# Patient Record
Sex: Female | Born: 1935
Health system: Southern US, Community
[De-identification: ages and names within clinical notes are randomized; demographics above are authoritative.]

## PROBLEM LIST (undated history)

## (undated) DIAGNOSIS — N201 Calculus of ureter: Secondary | ICD-10-CM

## (undated) DIAGNOSIS — I10 Essential (primary) hypertension: Secondary | ICD-10-CM

## (undated) DIAGNOSIS — Z8719 Personal history of other diseases of the digestive system: Secondary | ICD-10-CM

## (undated) DIAGNOSIS — Z87442 Personal history of urinary calculi: Secondary | ICD-10-CM

## (undated) DIAGNOSIS — N200 Calculus of kidney: Secondary | ICD-10-CM

## (undated) HISTORY — PX: APPENDECTOMY: SHX54

## (undated) HISTORY — PX: DILATION AND CURETTAGE OF UTERUS: SHX78

## (undated) HISTORY — PX: ABDOMINAL HYSTERECTOMY: SHX81

## (undated) HISTORY — PX: CATARACT EXTRACTION: SUR2

---

## 1998-01-29 ENCOUNTER — Ambulatory Visit (HOSPITAL_COMMUNITY): Admission: RE | Admit: 1998-01-29 | Discharge: 1998-01-29 | Payer: Self-pay | Admitting: Family Medicine

## 1998-09-11 ENCOUNTER — Encounter: Payer: Self-pay | Admitting: Family Medicine

## 1998-09-11 ENCOUNTER — Ambulatory Visit (HOSPITAL_COMMUNITY): Admission: RE | Admit: 1998-09-11 | Discharge: 1998-09-11 | Payer: Self-pay | Admitting: Family Medicine

## 1998-09-26 ENCOUNTER — Ambulatory Visit (HOSPITAL_COMMUNITY): Admission: RE | Admit: 1998-09-26 | Discharge: 1998-09-27 | Payer: Self-pay | Admitting: Family Medicine

## 1998-09-26 ENCOUNTER — Encounter: Payer: Self-pay | Admitting: Family Medicine

## 2000-11-30 ENCOUNTER — Other Ambulatory Visit: Admission: RE | Admit: 2000-11-30 | Discharge: 2000-11-30 | Payer: Self-pay | Admitting: *Deleted

## 2004-01-24 ENCOUNTER — Other Ambulatory Visit: Admission: RE | Admit: 2004-01-24 | Discharge: 2004-01-24 | Payer: Self-pay | Admitting: Family Medicine

## 2011-08-13 DIAGNOSIS — I10 Essential (primary) hypertension: Secondary | ICD-10-CM | POA: Diagnosis not present

## 2011-08-13 DIAGNOSIS — M549 Dorsalgia, unspecified: Secondary | ICD-10-CM | POA: Diagnosis not present

## 2011-08-13 DIAGNOSIS — N951 Menopausal and female climacteric states: Secondary | ICD-10-CM | POA: Diagnosis not present

## 2011-08-13 DIAGNOSIS — L219 Seborrheic dermatitis, unspecified: Secondary | ICD-10-CM | POA: Diagnosis not present

## 2011-08-13 DIAGNOSIS — E785 Hyperlipidemia, unspecified: Secondary | ICD-10-CM | POA: Diagnosis not present

## 2012-02-12 DIAGNOSIS — E785 Hyperlipidemia, unspecified: Secondary | ICD-10-CM | POA: Diagnosis not present

## 2012-02-12 DIAGNOSIS — I10 Essential (primary) hypertension: Secondary | ICD-10-CM | POA: Diagnosis not present

## 2012-02-12 DIAGNOSIS — N951 Menopausal and female climacteric states: Secondary | ICD-10-CM | POA: Diagnosis not present

## 2012-02-12 DIAGNOSIS — L820 Inflamed seborrheic keratosis: Secondary | ICD-10-CM | POA: Diagnosis not present

## 2012-02-12 DIAGNOSIS — Z23 Encounter for immunization: Secondary | ICD-10-CM | POA: Diagnosis not present

## 2012-02-25 DIAGNOSIS — H5200 Hypermetropia, unspecified eye: Secondary | ICD-10-CM | POA: Diagnosis not present

## 2012-02-25 DIAGNOSIS — H524 Presbyopia: Secondary | ICD-10-CM | POA: Diagnosis not present

## 2012-02-25 DIAGNOSIS — H10439 Chronic follicular conjunctivitis, unspecified eye: Secondary | ICD-10-CM | POA: Diagnosis not present

## 2012-02-25 DIAGNOSIS — H52229 Regular astigmatism, unspecified eye: Secondary | ICD-10-CM | POA: Diagnosis not present

## 2012-03-04 DIAGNOSIS — Z1231 Encounter for screening mammogram for malignant neoplasm of breast: Secondary | ICD-10-CM | POA: Diagnosis not present

## 2012-03-04 DIAGNOSIS — Z13820 Encounter for screening for osteoporosis: Secondary | ICD-10-CM | POA: Diagnosis not present

## 2012-08-11 DIAGNOSIS — N951 Menopausal and female climacteric states: Secondary | ICD-10-CM | POA: Diagnosis not present

## 2012-08-11 DIAGNOSIS — E785 Hyperlipidemia, unspecified: Secondary | ICD-10-CM | POA: Diagnosis not present

## 2012-08-11 DIAGNOSIS — I10 Essential (primary) hypertension: Secondary | ICD-10-CM | POA: Diagnosis not present

## 2012-10-12 DIAGNOSIS — E785 Hyperlipidemia, unspecified: Secondary | ICD-10-CM | POA: Diagnosis not present

## 2012-11-15 DIAGNOSIS — L740 Miliaria rubra: Secondary | ICD-10-CM | POA: Diagnosis not present

## 2013-02-09 DIAGNOSIS — E785 Hyperlipidemia, unspecified: Secondary | ICD-10-CM | POA: Diagnosis not present

## 2013-02-09 DIAGNOSIS — Z23 Encounter for immunization: Secondary | ICD-10-CM | POA: Diagnosis not present

## 2013-02-09 DIAGNOSIS — I10 Essential (primary) hypertension: Secondary | ICD-10-CM | POA: Diagnosis not present

## 2013-02-09 DIAGNOSIS — N951 Menopausal and female climacteric states: Secondary | ICD-10-CM | POA: Diagnosis not present

## 2013-02-28 DIAGNOSIS — H52229 Regular astigmatism, unspecified eye: Secondary | ICD-10-CM | POA: Diagnosis not present

## 2013-02-28 DIAGNOSIS — H01009 Unspecified blepharitis unspecified eye, unspecified eyelid: Secondary | ICD-10-CM | POA: Diagnosis not present

## 2013-02-28 DIAGNOSIS — H2589 Other age-related cataract: Secondary | ICD-10-CM | POA: Diagnosis not present

## 2013-02-28 DIAGNOSIS — H5200 Hypermetropia, unspecified eye: Secondary | ICD-10-CM | POA: Diagnosis not present

## 2013-05-02 DIAGNOSIS — H18419 Arcus senilis, unspecified eye: Secondary | ICD-10-CM | POA: Diagnosis not present

## 2013-05-02 DIAGNOSIS — H2510 Age-related nuclear cataract, unspecified eye: Secondary | ICD-10-CM | POA: Diagnosis not present

## 2013-05-02 DIAGNOSIS — H25019 Cortical age-related cataract, unspecified eye: Secondary | ICD-10-CM | POA: Diagnosis not present

## 2013-05-02 DIAGNOSIS — H25049 Posterior subcapsular polar age-related cataract, unspecified eye: Secondary | ICD-10-CM | POA: Diagnosis not present

## 2013-05-28 DIAGNOSIS — Z8719 Personal history of other diseases of the digestive system: Secondary | ICD-10-CM

## 2013-05-28 HISTORY — DX: Personal history of other diseases of the digestive system: Z87.19

## 2013-06-18 ENCOUNTER — Encounter (HOSPITAL_BASED_OUTPATIENT_CLINIC_OR_DEPARTMENT_OTHER): Payer: Self-pay | Admitting: Emergency Medicine

## 2013-06-18 ENCOUNTER — Inpatient Hospital Stay (HOSPITAL_BASED_OUTPATIENT_CLINIC_OR_DEPARTMENT_OTHER)
Admission: EM | Admit: 2013-06-18 | Discharge: 2013-06-22 | DRG: 379 | Disposition: A | Discharge status: Home / Self Care | Payer: Medicare Other | Attending: Internal Medicine | Admitting: Internal Medicine

## 2013-06-18 ENCOUNTER — Emergency Department (HOSPITAL_BASED_OUTPATIENT_CLINIC_OR_DEPARTMENT_OTHER): Payer: Medicare Other

## 2013-06-18 DIAGNOSIS — I10 Essential (primary) hypertension: Secondary | ICD-10-CM | POA: Diagnosis present

## 2013-06-18 DIAGNOSIS — K529 Noninfective gastroenteritis and colitis, unspecified: Secondary | ICD-10-CM | POA: Diagnosis present

## 2013-06-18 DIAGNOSIS — J301 Allergic rhinitis due to pollen: Secondary | ICD-10-CM | POA: Diagnosis present

## 2013-06-18 DIAGNOSIS — E872 Acidosis, unspecified: Secondary | ICD-10-CM

## 2013-06-18 DIAGNOSIS — K922 Gastrointestinal hemorrhage, unspecified: Secondary | ICD-10-CM

## 2013-06-18 DIAGNOSIS — D72829 Elevated white blood cell count, unspecified: Secondary | ICD-10-CM | POA: Diagnosis not present

## 2013-06-18 DIAGNOSIS — R109 Unspecified abdominal pain: Secondary | ICD-10-CM | POA: Diagnosis not present

## 2013-06-18 DIAGNOSIS — K5289 Other specified noninfective gastroenteritis and colitis: Secondary | ICD-10-CM | POA: Diagnosis present

## 2013-06-18 DIAGNOSIS — Z7982 Long term (current) use of aspirin: Secondary | ICD-10-CM | POA: Diagnosis not present

## 2013-06-18 DIAGNOSIS — Z79899 Other long term (current) drug therapy: Secondary | ICD-10-CM | POA: Diagnosis not present

## 2013-06-18 DIAGNOSIS — J309 Allergic rhinitis, unspecified: Secondary | ICD-10-CM

## 2013-06-18 DIAGNOSIS — N200 Calculus of kidney: Secondary | ICD-10-CM | POA: Diagnosis not present

## 2013-06-18 DIAGNOSIS — R1032 Left lower quadrant pain: Secondary | ICD-10-CM | POA: Diagnosis not present

## 2013-06-18 DIAGNOSIS — K5733 Diverticulitis of large intestine without perforation or abscess with bleeding: Principal | ICD-10-CM | POA: Diagnosis present

## 2013-06-18 DIAGNOSIS — J302 Other seasonal allergic rhinitis: Secondary | ICD-10-CM | POA: Diagnosis present

## 2013-06-18 HISTORY — DX: Essential (primary) hypertension: I10

## 2013-06-18 LAB — COMPREHENSIVE METABOLIC PANEL
ALT: 12 U/L (ref 0–35)
AST: 21 U/L (ref 0–37)
Albumin: 3.9 g/dL (ref 3.5–5.2)
Alkaline Phosphatase: 70 U/L (ref 39–117)
BUN: 20 mg/dL (ref 6–23)
CO2: 27 mEq/L (ref 19–32)
Calcium: 9.8 mg/dL (ref 8.4–10.5)
Chloride: 99 mEq/L (ref 96–112)
Creatinine, Ser: 0.8 mg/dL (ref 0.50–1.10)
GFR calc Af Amer: 80 mL/min — ABNORMAL LOW (ref >90)
GFR calc non Af Amer: 69 mL/min — ABNORMAL LOW (ref >90)
Glucose, Bld: 120 mg/dL — ABNORMAL HIGH (ref 70–99)
Potassium: 3.9 mEq/L (ref 3.7–5.3)
Sodium: 141 mEq/L (ref 137–147)
Total Bilirubin: 0.5 mg/dL (ref 0.3–1.2)
Total Protein: 7.8 g/dL (ref 6.0–8.3)

## 2013-06-18 LAB — CBC WITH DIFFERENTIAL/PLATELET
Basophils Absolute: 0 10*3/uL (ref 0.0–0.1)
Basophils Relative: 0 % (ref 0–1)
Eosinophils Absolute: 0.1 10*3/uL (ref 0.0–0.7)
Eosinophils Relative: 1 % (ref 0–5)
HCT: 40.6 % (ref 36.0–46.0)
Hemoglobin: 13.4 g/dL (ref 12.0–15.0)
Lymphocytes Relative: 21 % (ref 12–46)
Lymphs Abs: 3.8 10*3/uL (ref 0.7–4.0)
MCH: 30.5 pg (ref 26.0–34.0)
MCHC: 33 g/dL (ref 30.0–36.0)
MCV: 92.5 fL (ref 78.0–100.0)
Monocytes Absolute: 1.4 10*3/uL — ABNORMAL HIGH (ref 0.1–1.0)
Monocytes Relative: 8 % (ref 3–12)
Neutro Abs: 13 10*3/uL — ABNORMAL HIGH (ref 1.7–7.7)
Neutrophils Relative %: 71 % (ref 43–77)
Platelets: 211 10*3/uL (ref 150–400)
RBC: 4.39 MIL/uL (ref 3.87–5.11)
RDW: 12.5 % (ref 11.5–15.5)
WBC: 18.3 10*3/uL — ABNORMAL HIGH (ref 4.0–10.5)

## 2013-06-18 LAB — URINALYSIS, ROUTINE W REFLEX MICROSCOPIC
BILIRUBIN URINE: NEGATIVE
Glucose, UA: NEGATIVE mg/dL
Hgb urine dipstick: NEGATIVE
KETONES UR: NEGATIVE mg/dL
Leukocytes, UA: NEGATIVE
Nitrite: NEGATIVE
PH: 7 (ref 5.0–8.0)
Protein, ur: NEGATIVE mg/dL
Specific Gravity, Urine: 1.017 (ref 1.005–1.030)
Urobilinogen, UA: 0.2 mg/dL (ref 0.0–1.0)

## 2013-06-18 LAB — I-STAT CG4 LACTIC ACID, ED: Lactic Acid, Venous: 2.84 mmol/L — ABNORMAL HIGH (ref 0.5–2.2)

## 2013-06-18 LAB — OCCULT BLOOD X 1 CARD TO LAB, STOOL: Fecal Occult Bld: POSITIVE — AB

## 2013-06-18 MED ORDER — ACETAMINOPHEN 325 MG PO TABS: 650.0000 mg | ORAL_TABLET | Freq: Four times a day (QID) | ORAL | Status: DC | PRN

## 2013-06-18 MED ORDER — MORPHINE SULFATE 2 MG/ML IJ SOLN: 2.0000 mg | INTRAMUSCULAR | Status: DC | PRN

## 2013-06-18 MED ORDER — ONDANSETRON HCL 4 MG/2ML IJ SOLN: 4.0000 mg | Freq: Four times a day (QID) | INTRAMUSCULAR | Status: DC | PRN

## 2013-06-18 MED ORDER — MORPHINE SULFATE 4 MG/ML IJ SOLN
4.0000 mg | Freq: Once | INTRAMUSCULAR | Status: AC
  Administered 2013-06-18: 4 mg via INTRAVENOUS
  Filled 2013-06-18: qty 1

## 2013-06-18 MED ORDER — METRONIDAZOLE IN NACL 5-0.79 MG/ML-% IV SOLN
500.0000 mg | Freq: Once | INTRAVENOUS | Status: AC
  Administered 2013-06-18: 500 mg via INTRAVENOUS
  Filled 2013-06-18: qty 100

## 2013-06-18 MED ORDER — SODIUM CHLORIDE 0.9 % IV SOLN
INTRAVENOUS | Status: DC
  Administered 2013-06-18 – 2013-06-21 (×4): via INTRAVENOUS

## 2013-06-18 MED ORDER — CIPROFLOXACIN IN D5W 400 MG/200ML IV SOLN
400.0000 mg | Freq: Two times a day (BID) | INTRAVENOUS | Status: DC
  Administered 2013-06-18 – 2013-06-21 (×6): 400 mg via INTRAVENOUS
  Filled 2013-06-18 (×7): qty 200

## 2013-06-18 MED ORDER — ACETAMINOPHEN 650 MG RE SUPP: 650.0000 mg | Freq: Four times a day (QID) | RECTAL | Status: DC | PRN

## 2013-06-18 MED ORDER — ONDANSETRON HCL 4 MG PO TABS: 4.0000 mg | ORAL_TABLET | Freq: Four times a day (QID) | ORAL | Status: DC | PRN

## 2013-06-18 MED ORDER — METRONIDAZOLE IN NACL 5-0.79 MG/ML-% IV SOLN
500.0000 mg | Freq: Three times a day (TID) | INTRAVENOUS | Status: DC
  Administered 2013-06-18 – 2013-06-21 (×9): 500 mg via INTRAVENOUS
  Filled 2013-06-18 (×10): qty 100

## 2013-06-18 MED ORDER — ALUM & MAG HYDROXIDE-SIMETH 200-200-20 MG/5ML PO SUSP
30.0000 mL | Freq: Four times a day (QID) | ORAL | Status: DC | PRN
  Administered 2013-06-19 – 2013-06-22 (×7): 30 mL via ORAL
  Filled 2013-06-18 (×7): qty 30

## 2013-06-18 MED ORDER — CIPROFLOXACIN IN D5W 400 MG/200ML IV SOLN
400.0000 mg | Freq: Once | INTRAVENOUS | Status: AC
  Administered 2013-06-18: 400 mg via INTRAVENOUS
  Filled 2013-06-18: qty 200

## 2013-06-18 MED ORDER — IOHEXOL 300 MG/ML  SOLN
50.0000 mL | Freq: Once | INTRAMUSCULAR | Status: AC | PRN
  Administered 2013-06-18: 50 mL via ORAL

## 2013-06-18 MED ORDER — SODIUM CHLORIDE 0.9 % IV BOLUS (SEPSIS)
1000.0000 mL | Freq: Once | INTRAVENOUS | Status: AC
  Administered 2013-06-18: 1000 mL via INTRAVENOUS

## 2013-06-18 MED ORDER — IOHEXOL 300 MG/ML  SOLN
100.0000 mL | Freq: Once | INTRAMUSCULAR | Status: AC | PRN
  Administered 2013-06-18: 100 mL via INTRAVENOUS

## 2013-06-18 NOTE — ED Provider Notes (Signed)
CSN: [id number]     Arrival date & time 06/18/13  4098 History   First MD Initiated Contact with Patient 06/18/13 [phone]     Chief Complaint  Patient presents with  . Abdominal Pain     (Consider location/radiation/quality/duration/timing/severity/associated sxs/prior Treatment) HPI  This is a 80 year old female with a history of hypertension who presents with abdominal pain and prior red blood per rectum. Patient states that she had onset of abdominal cramping last night at 10 PM. She subsequently had a bowel movement that was "normal" that relieved her abdominal pain. She reports lower crampy abdominal pain that comes and goes. She states that it comes on and 10 out of 10 but then it gets much better. She currently is pain-free. It is nonradiating. Patient states that she had pain every hour or so last night and she had the urge to have a bowel movement. She didn't bathroom without any "results." She states that she noted 3-4 times that she had a quarter-sized amount of bright red blood the toilet. This was not associated with a bowel movement. She denies any other symptoms including fever, vomiting, chest pain or shortness of breath, urinary symptoms.  Past Medical History  Diagnosis Date  . Hypertension    Past Surgical History  Procedure Laterality Date  . Abdominal hysterectomy    . Appendectomy     No family history on file. History  Substance Use Topics  . Smoking status: Never Smoker   . Smokeless tobacco: Not on file  . Alcohol Use: No   OB History   Grav Para Term Preterm Abortions TAB SAB Ect Mult Living                 Review of Systems  Constitutional: Negative for fever and chills.  Respiratory: Negative for cough, chest tightness and shortness of breath.   Cardiovascular: Negative for chest pain.  Gastrointestinal: Positive for abdominal pain and blood in stool. Negative for nausea, vomiting and diarrhea.  Genitourinary: Negative for dysuria, hematuria and  vaginal bleeding.  Musculoskeletal: Negative for back pain.  Skin: Negative for wound.  Neurological: Negative for dizziness and headaches.  Psychiatric/Behavioral: Negative for confusion.  All other systems reviewed and are negative.      Allergies  Penicillins  Home Medications   Current Outpatient Rx  Name  Route  Sig  Dispense  Refill  . aspirin 81 MG tablet   Oral   Take 81 mg by mouth daily.         Marland Kitchen estradiol (ESTRACE) 0.5 MG tablet   Oral   Take 0.5 mg by mouth daily.         Marland Kitchen loratadine (CLARITIN) 10 MG tablet   Oral   Take 10 mg by mouth daily.         Marland Kitchen losartan-hydrochlorothiazide (HYZAAR) 100-12.5 MG per tablet   Oral   Take 1 tablet by mouth daily.         . Multiple Vitamins-Minerals (MULTIVITAMIN WITH MINERALS) tablet   Oral   Take 1 tablet by mouth daily.          BP 118/69  Pulse 95  Temp(Src) 98.4 F (36.9 C) (Oral)  Resp 18  SpO2 98% Physical Exam  Nursing note and vitals reviewed. Constitutional: She is oriented to person, place, and time. She appears well-developed and well-nourished. No distress.  HENT:  Head: Normocephalic and atraumatic.  Mouth/Throat: Oropharynx is clear and moist.  Eyes: Pupils are equal, round, and reactive to  light.  Neck: Neck supple.  Cardiovascular: Normal rate, regular rhythm and normal heart sounds.   No murmur heard. Pulmonary/Chest: Effort normal and breath sounds normal. No respiratory distress. She has no wheezes.  Abdominal: Soft. Bowel sounds are normal. She exhibits no distension and no mass. There is no tenderness. There is no rebound and no guarding.  Genitourinary:  Small amount of gross blood on rectal exam, hemorrhoid noted at 9:00 without significant bleeding, no significant tenderness  Musculoskeletal: She exhibits no edema.  Neurological: She is alert and oriented to person, place, and time.  Skin: Skin is warm and dry.  Psychiatric: She has a normal mood and affect.    ED  Course  Procedures (including critical care time) Labs Review Labs Reviewed  CBC WITH DIFFERENTIAL - Abnormal; Notable for the following:    WBC 18.3 (*)    Neutro Abs 13.0 (*)    Monocytes Absolute 1.4 (*)    All other components within normal limits  COMPREHENSIVE METABOLIC PANEL - Abnormal; Notable for the following:    Glucose, Bld 120 (*)    GFR calc non Af Amer 69 (*)    GFR calc Af Amer 80 (*)    All other components within normal limits  OCCULT BLOOD X 1 CARD TO LAB, STOOL - Abnormal; Notable for the following:    Fecal Occult Bld POSITIVE (*)    All other components within normal limits  URINALYSIS, ROUTINE W REFLEX MICROSCOPIC - Abnormal; Notable for the following:    APPearance CLOUDY (*)    All other components within normal limits  I-STAT CG4 LACTIC ACID, ED - Abnormal; Notable for the following:    Lactic Acid, Venous 2.84 (*)    All other components within normal limits   Imaging Review Dg Abd 1 View  06/18/2013   CLINICAL DATA:  Abdominal pain.  EXAM: ABDOMEN - 1 VIEW  COMPARISON:  None.  FINDINGS: Single erect view was performed per ordering physician. There is no evidence of free intraperitoneal air on this study. No dilated bowel loops seen in the upper abdomen. A 6 mm radiodensity is seen overlying left kidney, suspicious for a left renal calculus.  IMPRESSION: No evidence of free intraperitoneal air. Probable left renal calculus.   Electronically Signed   By: Earle Gell M.D.   On: 06/18/2013 09:54   Ct Abdomen Pelvis W Contrast  06/18/2013   CLINICAL DATA:  Abdominal pain and elevated white blood cell count  EXAM: CT ABDOMEN AND PELVIS WITH CONTRAST  TECHNIQUE: Multidetector CT imaging of the abdomen and pelvis was performed using the standard protocol following bolus administration of intravenous contrast.  CONTRAST:  92mL OMNIPAQUE IOHEXOL 300 MG/ML SOLN, 131mL OMNIPAQUE IOHEXOL 300 MG/ML SOLN  COMPARISON:  None.  FINDINGS: The lung bases are clear. No pleural or  pericardial effusion identified.  No focal liver abnormality identified. The gallbladder appears normal. No biliary dilatation. Normal appearance of the pancreas. The spleen appears normal.  The adrenal glands are both within normal limits. The right kidney appears normal. Left renal calculi are identified. The largest stone is in the inferior pole of the left kidney measuring 6 mm, image number 36/series 2. The urinary bladder appears normal. Previous hysterectomy. Cystic structure within the right adnexa measures 4.9 cm, image 70/series 2. This contains a thin internal area of septation, image 54/series 5.  There is calcified atherosclerotic disease involving the abdominal aorta. No aneurysm. No upper abdominal adenopathy identified. There is no pelvic or inguinal  adenopathy noted.  The stomach appears normal. The small bowel loops have a normal caliber without obstruction. The proximal colon appears normal. Abnormal wall thickening and inflammation involves the sigmoid colon. There is no perforation or abscess.  IMPRESSION: 1. Abnormal wall thickening and inflammation involving the sigmoid colon compatible with either segmental colitis or diverticulitis. The patient does not have many diverticula and there is no evidence for a perforated diverticula which favors inflammatory or infectious segmental colitis. 2. Mildly complicated cyst arising from the right adnexa. Recommend further assessment with pelvic sonogram. 3. Left renal calculi 4. Atherosclerotic disease.   Electronically Signed   By: Kerby Moors M.D.   On: 06/18/2013 11:45    EKG Interpretation   None       MDM   Final diagnoses:  Colitis  Lactic acidosis    Patient presents with left lower quadrant pain and rectal bleeding.  Exam is benign. She's not significantly tender and denies pain at this time. Concern for diverticulitis versus diverticulosis, colitis. She denies any postprandial pain.  10:00 AM Patient noted to have mild  lactic acidosis and leukocytosis to 18. Patient given fluids and CT scan ordered.  CT shows evidence of colitis versus diverticulitis. On reexamination, patient now endorses worsening pain. She is tender to palpation of the left lower quadrant. She is without signs of peritonitis. Patient was given fluids, Cipro, Flagyl. She continues to have rectal bleeding here.    Merryl Hacker, MD 06/18/13 1302

## 2013-06-18 NOTE — H&P (Signed)
[facility] History and Physical  Sandra Mullins [id number] DOB: 09/06/1932 DOA: 06/18/2013  Referring physician: EDP PCP: Shirline Frees, MD   Chief Complaint: abdominal pain and blood per rectum  HPI: Sandra Mullins is a 80 y.o. female  Presented to [facility] with initially upper abdominal tenderness that became localized to the left lower quadrant. She has had rectal bleeding. Her symptoms started last night and worsened. Initially, she had cramping and multiple loose stools. She did not turn on the light so did not realize they were bloody until early this morning. She has passed clots. She has had no nausea or vomiting. Apparently, her primary care provider attempted a flexible sigmoidoscopy which was not completed due to difficult anatomy per her report. Otherwise she's never had a total colonoscopy. She has had no fevers or chills. No weight loss. Prior to last night, has had normal stools. She has not been on antibiotics recently. No history of same. In the emergency room, she had a rectal exam which showed small amount of blood. CT of the abdomen and pelvis showed segmental colitis versus diverticulitis involving the sigmoid colon. White blood cell count was 18,000. Lactic acid was slightly elevated. Patient was afebrile and had normal vital signs. Currently she feels a little better. She has been transferred to the floor. Past medical history only significant for hypertension and seasonal allergies. She is a widow and lives alone, very independent.   Review of Systems:  Systems reviewed. As above otherwise negative.  Past Medical History  Diagnosis Date  . Hypertension    Past Surgical History  Procedure Laterality Date  . Abdominal hysterectomy    . Appendectomy     Social History:  reports that she has never smoked. She does not have any smokeless tobacco history on file. She reports that she does not drink alcohol or use illicit drugs.  Allergies    Allergen Reactions  . Penicillins     Family History  Problem Relation Age of Onset  . Lung cancer Mother   . COPD Father      Prior to Admission medications   Medication Sig Start Date End Date Taking? Authorizing Provider  aspirin 81 MG tablet Take 81 mg by mouth daily.   Yes Historical Provider, MD  estradiol (ESTRACE) 0.5 MG tablet Take 0.5 mg by mouth daily.   Yes Historical Provider, MD  loratadine (CLARITIN) 10 MG tablet Take 10 mg by mouth daily.   Yes Historical Provider, MD  losartan-hydrochlorothiazide (HYZAAR) 100-12.5 MG per tablet Take 1 tablet by mouth daily.   Yes Historical Provider, MD  Multiple Vitamins-Minerals (MULTIVITAMIN WITH MINERALS) tablet Take 1 tablet by mouth daily.   Yes Historical Provider, MD   Physical Exam: Filed Vitals:   06/18/13 1456  BP: 148/74  Pulse: 75  Temp: 97.6 F (36.4 C)  Resp: 18    BP 148/74  Pulse 75  Temp(Src) 97.6 F (36.4 C) (Oral)  Resp 18  Ht 5\' 4"  (1.626 m)  Wt 75.8 kg (167 lb 1.7 oz)  BMI 28.67 kg/m2  SpO2 97% BP 148/74  Pulse 75  Temp(Src) 97.6 F (36.4 C) (Oral)  Resp 18  Ht 5\' 4"  (1.626 m)  Wt 75.8 kg (167 lb 1.7 oz)  BMI 28.67 kg/m2  SpO2 97%  General Appearance:    Alert, cooperative, no distress, nontoxic, appears stated age  Head:    Normocephalic, without obvious abnormality, atraumatic  Eyes:    PERRL, conjunctiva/corneas clear, EOM's  intact, fundi    benign, both eyes     Nose:   Nares normal, septum midline, mucosa normal, no drainage    or sinus tenderness  Throat:   Lips, mucosa, and tongue normal; teeth and gums normal  Neck:   Supple, symmetrical, trachea midline, no adenopathy;    thyroid:  no enlargement/tenderness/nodules; no carotid   bruit or JVD  Back:     Symmetric, no curvature, ROM normal, no CVA tenderness  Lungs:     Clear to auscultation bilaterally, respirations unlabored  Chest Wall:    No tenderness or deformity   Heart:    Regular rate and rhythm, S1 and S2 normal, no  murmur, rub   or gallop     Abdomen:     Soft, non-tender, bowel sounds active all four quadrants,    no masses, no organomegaly  Genitalia:    deferred  Rectal:   per EDP blood, hemorrhoid without bleeding or tenderness  Extremities:   Extremities normal, atraumatic, no cyanosis or edema  Pulses:   2+ and symmetric all extremities  Skin:   Skin color, texture, turgor normal, no rashes or lesions  Lymph nodes:   Cervical, supraclavicular, and axillary nodes normal  Neurologic:   CNII-XII intact, normal strength, sensation and reflexes    throughout    Psych: normal affect       Labs on Admission:  Basic Metabolic Panel:  Recent Labs Lab 06/18/13 0920  NA 141  K 3.9  CL 99  CO2 27  GLUCOSE 120*  BUN 20  CREATININE 0.80  CALCIUM 9.8   Liver Function Tests:  Recent Labs Lab 06/18/13 0920  AST 21  ALT 12  ALKPHOS 70  BILITOT 0.5  PROT 7.8  ALBUMIN 3.9   No results found for this basename: LIPASE, AMYLASE,  in the last 168 hours No results found for this basename: AMMONIA,  in the last 168 hours CBC:  Recent Labs Lab 06/18/13 0920  WBC 18.3*  NEUTROABS 13.0*  HGB 13.4  HCT 40.6  MCV 92.5  PLT 211   Cardiac Enzymes: No results found for this basename: CKTOTAL, CKMB, CKMBINDEX, TROPONINI,  in the last 168 hours  BNP (last 3 results) No results found for this basename: PROBNP,  in the last 8760 hours CBG: No results found for this basename: GLUCAP,  in the last 168 hours  Radiological Exams on Admission: Dg Abd 1 View  06/18/2013   CLINICAL DATA:  Abdominal pain.  EXAM: ABDOMEN - 1 VIEW  COMPARISON:  None.  FINDINGS: Single erect view was performed per ordering physician. There is no evidence of free intraperitoneal air on this study. No dilated bowel loops seen in the upper abdomen. A 6 mm radiodensity is seen overlying left kidney, suspicious for a left renal calculus.  IMPRESSION: No evidence of free intraperitoneal air. Probable left renal calculus.    Electronically Signed   By: Earle Gell M.D.   On: 06/18/2013 09:54   Ct Abdomen Pelvis W Contrast  06/18/2013   CLINICAL DATA:  Abdominal pain and elevated white blood cell count  EXAM: CT ABDOMEN AND PELVIS WITH CONTRAST  TECHNIQUE: Multidetector CT imaging of the abdomen and pelvis was performed using the standard protocol following bolus administration of intravenous contrast.  CONTRAST:  34mL OMNIPAQUE IOHEXOL 300 MG/ML SOLN, 141mL OMNIPAQUE IOHEXOL 300 MG/ML SOLN  COMPARISON:  None.  FINDINGS: The lung bases are clear. No pleural or pericardial effusion identified.  No focal liver abnormality  identified. The gallbladder appears normal. No biliary dilatation. Normal appearance of the pancreas. The spleen appears normal.  The adrenal glands are both within normal limits. The right kidney appears normal. Left renal calculi are identified. The largest stone is in the inferior pole of the left kidney measuring 6 mm, image number 36/series 2. The urinary bladder appears normal. Previous hysterectomy. Cystic structure within the right adnexa measures 4.9 cm, image 70/series 2. This contains a thin internal area of septation, image 54/series 5.  There is calcified atherosclerotic disease involving the abdominal aorta. No aneurysm. No upper abdominal adenopathy identified. There is no pelvic or inguinal adenopathy noted.  The stomach appears normal. The small bowel loops have a normal caliber without obstruction. The proximal colon appears normal. Abnormal wall thickening and inflammation involves the sigmoid colon. There is no perforation or abscess.  IMPRESSION: 1. Abnormal wall thickening and inflammation involving the sigmoid colon compatible with either segmental colitis or diverticulitis. The patient does not have many diverticula and there is no evidence for a perforated diverticula which favors inflammatory or infectious segmental colitis. 2. Mildly complicated cyst arising from the right adnexa. Recommend  further assessment with pelvic sonogram. 3. Left renal calculi 4. Atherosclerotic disease.   Electronically Signed   By: Kerby Moors M.D.   On: 06/18/2013 11:45   Assessment/Plan    Colitis v. Diverticulitis. Empiric cipro, flagyl. Stool for c diff, culture if diarrhea. Clears.   Lower GI bleed: monitor H/H. Will need eventual colonoscopy, likely as outpt if improves on abx.   Essential hypertension, benign: hold meds for now   Seasonal allergies   Code Status: full Family Communication: multiple at bedside Disposition Plan: home  Time spent: 23 min  [facility] Pager [phone]

## 2013-06-18 NOTE — ED Notes (Signed)
Patient experienced abd pain at 22:00 yesterday and had a bm which eased pain. abd pain several times afterwards (severe cramps) but no BM some blood in toilet, quarter sized

## 2013-06-19 LAB — BASIC METABOLIC PANEL
BUN: 12 mg/dL (ref 6–23)
CALCIUM: 9.1 mg/dL (ref 8.4–10.5)
CO2: 27 meq/L (ref 19–32)
Chloride: 101 mEq/L (ref 96–112)
Creatinine, Ser: 0.83 mg/dL (ref 0.50–1.10)
GFR calc Af Amer: 76 mL/min — ABNORMAL LOW (ref >90)
GFR, EST NON AFRICAN AMERICAN: 66 mL/min — AB (ref >90)
GLUCOSE: 83 mg/dL (ref 70–99)
POTASSIUM: 3.7 meq/L (ref 3.7–5.3)
Sodium: 140 mEq/L (ref 137–147)

## 2013-06-19 LAB — CBC WITH DIFFERENTIAL/PLATELET
Basophils Absolute: 0 10*3/uL (ref 0.0–0.1)
Basophils Relative: 0 % (ref 0–1)
EOS PCT: 1 % (ref 0–5)
Eosinophils Absolute: 0.2 10*3/uL (ref 0.0–0.7)
HCT: 36.5 % (ref 36.0–46.0)
Hemoglobin: 12.3 g/dL (ref 12.0–15.0)
LYMPHS ABS: 4 10*3/uL (ref 0.7–4.0)
LYMPHS PCT: 28 % (ref 12–46)
MCH: 31.1 pg (ref 26.0–34.0)
MCHC: 33.7 g/dL (ref 30.0–36.0)
MCV: 92.4 fL (ref 78.0–100.0)
Monocytes Absolute: 1.1 10*3/uL — ABNORMAL HIGH (ref 0.1–1.0)
Monocytes Relative: 8 % (ref 3–12)
NEUTROS PCT: 63 % (ref 43–77)
Neutro Abs: 9.1 10*3/uL — ABNORMAL HIGH (ref 1.7–7.7)
Platelets: 173 10*3/uL (ref 150–400)
RBC: 3.95 MIL/uL (ref 3.87–5.11)
RDW: 12.9 % (ref 11.5–15.5)
WBC: 14.4 10*3/uL — AB (ref 4.0–10.5)

## 2013-06-19 LAB — CLOSTRIDIUM DIFFICILE BY PCR: Toxigenic C. Difficile by PCR: NEGATIVE

## 2013-06-19 NOTE — Progress Notes (Signed)
[facility] PROGRESS NOTE  Sandra Mullins KXF:[id number] DOB: 25-Jan-1933 DOA: 06/18/2013 PCP: Shirline Frees, MD  Assessment/Plan: 80 y/o female with PMH of HTN presented with abdominal pian is admitted with colitis, vs diverticulitis   1. Colitis v. Diverticulitis. CT: abd: Abnormal wall thickening and inflammation involving the sigmoid colon compatible with either segmental colitis or diverticulitis -started empiric cipro, flagyl. Stool for c diff, culture if diarrhea. Clears.   2. Lower GI bleed: resolving; monitor Hg -monitor H/H. Will need eventual colonoscopy, likely as outpt if improves on abx.   3. Essential hypertension, benign: hold meds for now    Code Status: full Family Communication: d/w patient, her daughter  (indicate person spoken with, relationship, and if by phone, the number) Disposition Plan: home 2-3 days   Consultants:  None   Procedures:  None   Antibiotics:  cirpo 2/22<<<<<  Flagyl 2/22<<<<   (indicate start date, and stop date if known)  HPI/Subjective: alert  Objective: Filed Vitals:   06/19/13 0647  BP: 109/53  Pulse: 70  Temp: 98 F (36.7 C)  Resp: 19    Intake/Output Summary (Last 24 hours) at 06/19/13 1034 Last data filed at 06/19/13 1026  Gross per 24 hour  Intake 1206.67 ml  Output   1150 ml  Net  56.67 ml   Filed Weights   06/18/13 1456  Weight: 75.8 kg (167 lb 1.7 oz)    Exam:   General:  alert  Cardiovascular: s1,s2 rrr  Respiratory: CTA BL  Abdomen: soft, mild L side tender, no rebound   Musculoskeletal: no LE edema   Data Reviewed: Basic Metabolic Panel:  Recent Labs Lab 06/18/13 0920 06/19/13 0605  NA 141 140  K 3.9 3.7  CL 99 101  CO2 27 27  GLUCOSE 120* 83  BUN 20 12  CREATININE 0.80 0.83  CALCIUM 9.8 9.1   Liver Function Tests:  Recent Labs Lab 06/18/13 0920  AST 21  ALT 12  ALKPHOS 70  BILITOT 0.5  PROT 7.8  ALBUMIN 3.9   No results found for this basename: LIPASE,  AMYLASE,  in the last 168 hours No results found for this basename: AMMONIA,  in the last 168 hours CBC:  Recent Labs Lab 06/18/13 0920 06/19/13 0605  WBC 18.3* 14.4*  NEUTROABS 13.0* 9.1*  HGB 13.4 12.3  HCT 40.6 36.5  MCV 92.5 92.4  PLT 211 173   Cardiac Enzymes: No results found for this basename: CKTOTAL, CKMB, CKMBINDEX, TROPONINI,  in the last 168 hours BNP (last 3 results) No results found for this basename: PROBNP,  in the last 8760 hours CBG: No results found for this basename: GLUCAP,  in the last 168 hours  No results found for this or any previous visit (from the past 240 hour(s)).   Studies: Dg Abd 1 View  06/18/2013   CLINICAL DATA:  Abdominal pain.  EXAM: ABDOMEN - 1 VIEW  COMPARISON:  None.  FINDINGS: Single erect view was performed per ordering physician. There is no evidence of free intraperitoneal air on this study. No dilated bowel loops seen in the upper abdomen. A 6 mm radiodensity is seen overlying left kidney, suspicious for a left renal calculus.  IMPRESSION: No evidence of free intraperitoneal air. Probable left renal calculus.   Electronically Signed   By: Earle Gell M.D.   On: 06/18/2013 09:54   Ct Abdomen Pelvis W Contrast  06/18/2013   CLINICAL DATA:  Abdominal pain and elevated white blood cell count  EXAM: CT ABDOMEN AND PELVIS WITH CONTRAST  TECHNIQUE: Multidetector CT imaging of the abdomen and pelvis was performed using the standard protocol following bolus administration of intravenous contrast.  CONTRAST:  32mL OMNIPAQUE IOHEXOL 300 MG/ML SOLN, 127mL OMNIPAQUE IOHEXOL 300 MG/ML SOLN  COMPARISON:  None.  FINDINGS: The lung bases are clear. No pleural or pericardial effusion identified.  No focal liver abnormality identified. The gallbladder appears normal. No biliary dilatation. Normal appearance of the pancreas. The spleen appears normal.  The adrenal glands are both within normal limits. The right kidney appears normal. Left renal calculi are  identified. The largest stone is in the inferior pole of the left kidney measuring 6 mm, image number 36/series 2. The urinary bladder appears normal. Previous hysterectomy. Cystic structure within the right adnexa measures 4.9 cm, image 70/series 2. This contains a thin internal area of septation, image 54/series 5.  There is calcified atherosclerotic disease involving the abdominal aorta. No aneurysm. No upper abdominal adenopathy identified. There is no pelvic or inguinal adenopathy noted.  The stomach appears normal. The small bowel loops have a normal caliber without obstruction. The proximal colon appears normal. Abnormal wall thickening and inflammation involves the sigmoid colon. There is no perforation or abscess.  IMPRESSION: 1. Abnormal wall thickening and inflammation involving the sigmoid colon compatible with either segmental colitis or diverticulitis. The patient does not have many diverticula and there is no evidence for a perforated diverticula which favors inflammatory or infectious segmental colitis. 2. Mildly complicated cyst arising from the right adnexa. Recommend further assessment with pelvic sonogram. 3. Left renal calculi 4. Atherosclerotic disease.   Electronically Signed   By: Kerby Moors M.D.   On: 06/18/2013 11:45    Scheduled Meds: . ciprofloxacin  400 mg Intravenous Q12H  . metronidazole  500 mg Intravenous Q8H   Continuous Infusions: . sodium chloride 50 mL/hr at 06/18/13 1652    Active Problems:   Colitis   Essential hypertension, benign   Lower GI bleed   Seasonal allergies    Time spent: >35 minutes     Kinnie Feil  [facility] Pager [phone]. If 7PM-7AM, please contact night-coverage at [email or web address], password [email or web address] 06/19/2013, 10:34 AM  LOS: 1 day

## 2013-06-20 LAB — GI PATHOGEN PANEL BY PCR, STOOL
C difficile toxin A/B: NEGATIVE
CAMPYLOBACTER BY PCR: NEGATIVE
Cryptosporidium by PCR: NEGATIVE
E COLI (STEC): NEGATIVE
E coli (ETEC) LT/ST: NEGATIVE
E coli 0157 by PCR: NEGATIVE
G LAMBLIA BY PCR: NEGATIVE
Norovirus GI/GII: NEGATIVE
ROTAVIRUS A BY PCR: NEGATIVE
SALMONELLA BY PCR: NEGATIVE
Shigella by PCR: NEGATIVE

## 2013-06-20 LAB — CBC
HCT: 34.4 % — ABNORMAL LOW (ref 36.0–46.0)
HEMOGLOBIN: 11.6 g/dL — AB (ref 12.0–15.0)
MCH: 31.1 pg (ref 26.0–34.0)
MCHC: 33.7 g/dL (ref 30.0–36.0)
MCV: 92.2 fL (ref 78.0–100.0)
PLATELETS: 164 10*3/uL (ref 150–400)
RBC: 3.73 MIL/uL — ABNORMAL LOW (ref 3.87–5.11)
RDW: 13 % (ref 11.5–15.5)
WBC: 13.5 10*3/uL — AB (ref 4.0–10.5)

## 2013-06-20 LAB — GLUCOSE, CAPILLARY: GLUCOSE-CAPILLARY: 92 mg/dL (ref 70–99)

## 2013-06-20 NOTE — Progress Notes (Signed)
[facility] PROGRESS NOTE  Sandra Mullins VPX:[id number] DOB: 1932-07-08 DOA: 06/18/2013 PCP: Shirline Frees, MD  Assessment/Plan: 80 y/o female with PMH of HTN presented with abdominal pian is admitted with colitis, vs diverticulitis   1. Colitis v. Diverticulitis. CT: abd: Abnormal wall thickening and inflammation involving the sigmoid colon compatible with either segmental colitis or diverticulitis -improving on empiric cipro, flagyl. Stool for c diff neg, c Gi pathogen pend;  -diet as tolerated    2. Lower GI bleed: resolving; monitor Hg -monitor H/H. Will need eventual colonoscopy, likely as outpt if improves on abx.   3. Essential hypertension, benign: hold meds for now    Code Status: full Family Communication: d/w patient, her daughter  (indicate person spoken with, relationship, and if by phone, the number) Disposition Plan: home 2-3 days   Consultants:  None   Procedures:  None   Antibiotics:  cirpo 2/22<<<<<  Flagyl 2/22<<<<   (indicate start date, and stop date if known)  HPI/Subjective: alert  Objective: Filed Vitals:   06/20/13 0646  BP: 103/58  Pulse: 70  Temp: 98.6 F (37 C)  Resp: 19    Intake/Output Summary (Last 24 hours) at 06/20/13 1129 Last data filed at 06/20/13 0600  Gross per 24 hour  Intake   2768 ml  Output   1200 ml  Net   1568 ml   Filed Weights   06/18/13 1456  Weight: 75.8 kg (167 lb 1.7 oz)    Exam:   General:  alert  Cardiovascular: s1,s2 rrr  Respiratory: CTA BL  Abdomen: soft, mild L side tender, no rebound   Musculoskeletal: no LE edema   Data Reviewed: Basic Metabolic Panel:  Recent Labs Lab 06/18/13 0920 06/19/13 0605  NA 141 140  K 3.9 3.7  CL 99 101  CO2 27 27  GLUCOSE 120* 83  BUN 20 12  CREATININE 0.80 0.83  CALCIUM 9.8 9.1   Liver Function Tests:  Recent Labs Lab 06/18/13 0920  AST 21  ALT 12  ALKPHOS 70  BILITOT 0.5  PROT 7.8  ALBUMIN 3.9   No results found for  this basename: LIPASE, AMYLASE,  in the last 168 hours No results found for this basename: AMMONIA,  in the last 168 hours CBC:  Recent Labs Lab 06/18/13 0920 06/19/13 0605 06/20/13 0540  WBC 18.3* 14.4* 13.5*  NEUTROABS 13.0* 9.1*  --   HGB 13.4 12.3 11.6*  HCT 40.6 36.5 34.4*  MCV 92.5 92.4 92.2  PLT 211 173 164   Cardiac Enzymes: No results found for this basename: CKTOTAL, CKMB, CKMBINDEX, TROPONINI,  in the last 168 hours BNP (last 3 results) No results found for this basename: PROBNP,  in the last 8760 hours CBG:  Recent Labs Lab 06/20/13 0750  GLUCAP 92    Recent Results (from the past 240 hour(s))  CLOSTRIDIUM DIFFICILE BY PCR     Status: None   Collection Time    06/19/13 10:45 AM      Result Value Ref Range Status   C difficile by pcr NEGATIVE  NEGATIVE Final  STOOL CULTURE     Status: None   Collection Time    06/19/13 10:45 AM      Result Value Ref Range Status   Specimen Description STOOL   Final   Special Requests NONE   Final   Culture     Final   Value: Culture reincubated for better growth     Performed at [facility]  Report Status PENDING   Incomplete     Studies: No results found.  Scheduled Meds: . ciprofloxacin  400 mg Intravenous Q12H  . metronidazole  500 mg Intravenous Q8H   Continuous Infusions: . sodium chloride 75 mL/hr at 06/20/13 1120    Active Problems:   Colitis   Essential hypertension, benign   Lower GI bleed   Seasonal allergies    Time spent: >35 minutes     Kinnie Feil  [facility] Pager [phone]. If 7PM-7AM, please contact night-coverage at [email or web address], password [facility] 06/20/2013, 11:29 AM  LOS: 2 days

## 2013-06-21 LAB — CBC
HCT: 32.6 % — ABNORMAL LOW (ref 36.0–46.0)
HEMOGLOBIN: 11 g/dL — AB (ref 12.0–15.0)
MCH: 31.1 pg (ref 26.0–34.0)
MCHC: 33.7 g/dL (ref 30.0–36.0)
MCV: 92.1 fL (ref 78.0–100.0)
PLATELETS: 162 10*3/uL (ref 150–400)
RBC: 3.54 MIL/uL — AB (ref 3.87–5.11)
RDW: 12.8 % (ref 11.5–15.5)
WBC: 11.8 10*3/uL — AB (ref 4.0–10.5)

## 2013-06-21 MED ORDER — CIPROFLOXACIN HCL 500 MG PO TABS
500.0000 mg | ORAL_TABLET | Freq: Two times a day (BID) | ORAL | Status: DC
  Administered 2013-06-21 – 2013-06-22 (×2): 500 mg via ORAL
  Filled 2013-06-21 (×3): qty 1

## 2013-06-21 MED ORDER — METRONIDAZOLE 500 MG PO TABS
500.0000 mg | ORAL_TABLET | Freq: Three times a day (TID) | ORAL | Status: DC
  Administered 2013-06-21 – 2013-06-22 (×2): 500 mg via ORAL
  Filled 2013-06-21 (×5): qty 1

## 2013-06-21 NOTE — Progress Notes (Signed)
[facility] PROGRESS NOTE  MARCELENE WEIDEMANN OEV:[id number] DOB: May 26, 1932 DOA: 06/18/2013 PCP: Shirline Frees, MD  Assessment/Plan: 80 y/o female with PMH of HTN presented with abdominal pian is admitted with colitis, vs diverticulitis   1. Colitis v. Diverticulitis. CT: abd: Abnormal wall thickening and inflammation involving the sigmoid colon compatible with either segmental colitis or diverticulitis -Clinical improvement on empiric cipro, flagyl. Stool for c diff neg, c Gi pathogen panel negative;  -diet as tolerated   Change IV antibiotics to oral antibiotics. Follow.  2. Lower GI bleed: Likely secondary to problem #1, resolving; monitor Hg -monitor H/H. Will need eventual colonoscopy, likely as outpt if improves on abx.   3. Essential hypertension, benign: hold meds for now. Continue gentle hydration.   Code Status: full Family Communication: d/w patient, no family at bedside. Disposition Plan: home in 1-2 days   Consultants:  None   Procedures:  CT abdomen and pelvis 06/18/2013  Abdominal x-ray 06/18/2013  Antibiotics:  IV cirpo 2/22<<<<< 06/21/13  IV Flagyl 2/22<<<< 06/21/13  Oral Cipro 2/25/ 2015  Oral Flagyl 06/21/2013  HPI/Subjective: Alert. Patient states she's feeling much better. Tolerating current diet. No further bleeding. No nausea, no emesis.  Objective: Filed Vitals:   06/21/13 1411  BP: 120/85  Pulse: 80  Temp: 98.7 F (37.1 C)  Resp: 18    Intake/Output Summary (Last 24 hours) at 06/21/13 1657 Last data filed at 06/21/13 1300  Gross per 24 hour  Intake   2460 ml  Output   1500 ml  Net    960 ml   Filed Weights   06/18/13 1456  Weight: 75.8 kg (167 lb 1.7 oz)    Exam:   General:  Alert, nad  Cardiovascular: s1,s2 rrr  Respiratory: CTA BL  Abdomen: soft, mild L side tenderness to palpation, no rebound   Musculoskeletal: no LE edema   Data Reviewed: Basic Metabolic Panel:  Recent Labs Lab 06/18/13 0920  06/19/13 0605  NA 141 140  K 3.9 3.7  CL 99 101  CO2 27 27  GLUCOSE 120* 83  BUN 20 12  CREATININE 0.80 0.83  CALCIUM 9.8 9.1   Liver Function Tests:  Recent Labs Lab 06/18/13 0920  AST 21  ALT 12  ALKPHOS 70  BILITOT 0.5  PROT 7.8  ALBUMIN 3.9   No results found for this basename: LIPASE, AMYLASE,  in the last 168 hours No results found for this basename: AMMONIA,  in the last 168 hours CBC:  Recent Labs Lab 06/18/13 0920 06/19/13 0605 06/20/13 0540 06/21/13 0323  WBC 18.3* 14.4* 13.5* 11.8*  NEUTROABS 13.0* 9.1*  --   --   HGB 13.4 12.3 11.6* 11.0*  HCT 40.6 36.5 34.4* 32.6*  MCV 92.5 92.4 92.2 92.1  PLT 211 173 164 162   Cardiac Enzymes: No results found for this basename: CKTOTAL, CKMB, CKMBINDEX, TROPONINI,  in the last 168 hours BNP (last 3 results) No results found for this basename: PROBNP,  in the last 8760 hours CBG:  Recent Labs Lab 06/20/13 0750  GLUCAP 92    Recent Results (from the past 240 hour(s))  CLOSTRIDIUM DIFFICILE BY PCR     Status: None   Collection Time    06/19/13 10:45 AM      Result Value Ref Range Status   C difficile by pcr NEGATIVE  NEGATIVE Final  STOOL CULTURE     Status: None   Collection Time    06/19/13 10:45 AM  Result Value Ref Range Status   Specimen Description STOOL   Final   Special Requests NONE   Final   Culture     Final   Value: NO SUSPICIOUS COLONIES, CONTINUING TO HOLD     Performed at [facility]   Report Status PENDING   Incomplete     Studies: No results found.  Scheduled Meds: . ciprofloxacin  500 mg Oral BID  . metroNIDAZOLE  500 mg Oral 3 times per day   Continuous Infusions: . sodium chloride 75 mL/hr at 06/21/13 8341    Principal Problem:   Colitis Active Problems:   Essential hypertension, benign   Lower GI bleed   Seasonal allergies    Time spent: >35 minutes     [facility] MD [facility] Pager [phone]. If 7PM-7AM, please contact  night-coverage at [email or web address], password [facility] 06/21/2013, 4:57 PM  LOS: 3 days

## 2013-06-22 LAB — CBC
HEMATOCRIT: 35.2 % — AB (ref 36.0–46.0)
HEMOGLOBIN: 11.5 g/dL — AB (ref 12.0–15.0)
MCH: 30.1 pg (ref 26.0–34.0)
MCHC: 32.7 g/dL (ref 30.0–36.0)
MCV: 92.1 fL (ref 78.0–100.0)
Platelets: 186 10*3/uL (ref 150–400)
RBC: 3.82 MIL/uL — AB (ref 3.87–5.11)
RDW: 13 % (ref 11.5–15.5)
WBC: 11.7 10*3/uL — ABNORMAL HIGH (ref 4.0–10.5)

## 2013-06-22 LAB — BASIC METABOLIC PANEL
BUN: 10 mg/dL (ref 6–23)
CHLORIDE: 106 meq/L (ref 96–112)
CO2: 24 meq/L (ref 19–32)
Calcium: 8.4 mg/dL (ref 8.4–10.5)
Creatinine, Ser: 0.81 mg/dL (ref 0.50–1.10)
GFR calc non Af Amer: 68 mL/min — ABNORMAL LOW (ref >90)
GFR, EST AFRICAN AMERICAN: 79 mL/min — AB (ref >90)
GLUCOSE: 97 mg/dL (ref 70–99)
Potassium: 3.8 mEq/L (ref 3.7–5.3)
Sodium: 142 mEq/L (ref 137–147)

## 2013-06-22 MED ORDER — LORATADINE 10 MG PO TABS: 10.0000 mg | ORAL_TABLET | Freq: Every day | ORAL | Status: DC

## 2013-06-22 MED ORDER — LOSARTAN POTASSIUM-HCTZ 100-12.5 MG PO TABS: 1.0000 | ORAL_TABLET | Freq: Every day | ORAL | Status: DC

## 2013-06-22 MED ORDER — ESTRADIOL 1 MG PO TABS
0.5000 mg | ORAL_TABLET | Freq: Every day | ORAL | Status: DC
  Filled 2013-06-22: qty 0.5

## 2013-06-22 MED ORDER — MULTI-VITAMIN/MINERALS PO TABS: 1.0000 | ORAL_TABLET | Freq: Every day | ORAL | Status: DC

## 2013-06-22 MED ORDER — METRONIDAZOLE 500 MG PO TABS: 500.0000 mg | ORAL_TABLET | Freq: Three times a day (TID) | ORAL | Status: DC

## 2013-06-22 MED ORDER — ADULT MULTIVITAMIN W/MINERALS CH: 1.0000 | ORAL_TABLET | Freq: Every day | ORAL | Status: DC

## 2013-06-22 MED ORDER — CIPROFLOXACIN HCL 500 MG PO TABS: 500.0000 mg | ORAL_TABLET | Freq: Two times a day (BID) | ORAL | Status: DC

## 2013-06-22 NOTE — Discharge Summary (Signed)
Physician Discharge Summary  [name] [id number] DOB: 22-Jun-1932 DOA: 06/18/2013  PCP: Shirline Frees, MD  Admit date: 06/18/2013 Discharge date: 06/22/2013  Time spent: 65 minutes  Recommendations for Outpatient Follow-up:  1. Followup with Shirline Frees, MD one week post discharge. On followup patient blood pressure need to be reassessed. Patient need a CBC done to followup on H&H. Patient may benefit from a GI referral for colonoscopy she has not had one done.  Discharge Diagnoses:  Principal Problem:   Colitis Active Problems:   Essential hypertension, benign   Lower GI bleed   Seasonal allergies   Discharge Condition: Stable and improved  Diet recommendation: Regular/soft diet  Filed Weights   06/18/13 1456  Weight: 75.8 kg (167 lb 1.7 oz)    History of present illness:  Sandra Mullins is a 80 y.o. female  Presented to [facility] with initially upper abdominal tenderness that became localized to the left lower quadrant. She has had rectal bleeding. Her symptoms started last night and worsened. Initially, she had cramping and multiple loose stools. She did not turn on the light so did not realize they were bloody until early this morning. She has passed clots. She has had no nausea or vomiting. Apparently, her primary care provider attempted a flexible sigmoidoscopy which was not completed due to difficult anatomy per her report. Otherwise she's never had a total colonoscopy. She has had no fevers or chills. No weight loss. Prior to last night, has had normal stools. She has not been on antibiotics recently. No history of same. In the emergency room, she had a rectal exam which showed small amount of blood. CT of the abdomen and pelvis showed segmental colitis versus diverticulitis involving the sigmoid colon. White blood cell count was 18,000. Lactic acid was slightly elevated. Patient was afebrile and had normal vital signs. Currently she feels a little  better. She has been transferred to the floor. Past medical history only significant for hypertension and seasonal allergies. She is a widow and lives alone, very independent   Hospital Course:  #1 colitis versus diverticulitis Patient had presented with abdominal pain with multiple loose stools. Patient also noted passing some clots of blood prior to admission. Abdominal x-rays which were done in the emergency room was negative for any acute abnormalities. CT of the abdomen and pelvis which was done was concerning for segmental colitis versus diverticulitis. Patient was admitted placed on IV fluids, supportive care, IV ciprofloxacin and IV Flagyl. Stool C. difficile PCR was sent which came back negative. Patient was subsequently started on clear liquids and diet was slowly advanced to a solid diet. Patient improved clinically abdominal pain improved and patient's leukocytosis improved as well. Patient was subsequently transitioned from IV antibiotics oral antibiotics which patient tolerated. Patient be discharged home in stable and improved condition to followup with PCP as outpatient. Patient be discharged on 7 more days of oral ciprofloxacin and oral Flagyl to complete a ten-day course on a bout of therapy. Patient was discharged in stable and improved condition.  #2 probable lower GI bleed On presentation patient had some complaints of passing some clots. CT of the abdomen and pelvis which was done was consistent with a segmental colitis versus a diverticulitis. Patient was hydrated with IV fluids and monitored. Patient did not have any further bleeding and was felt patient's probable lower GI bleed secondary to problem #1. Patient improved clinically her hemoglobin stabilized and patient was discharged in stable and improved condition.  Patient will need to followup with PCP as outpatient and may benefit from a referral to GI for outpatient colonoscopy. Patient discharged in stable and improved  condition.  #3 hypertension During the hospitalization patient had borderline blood pressure and a such her antihypertensive medications were held. Patient was hydrated gently with IV fluids. Patient will will be instructed to resume her antihypertensive medications 2-3 days post discharge. Patient to followup with PCP as outpatient.  Procedures: CT abdomen and pelvis 06/18/2013  Abdominal x-ray 06/18/2013     Consultations:  None  Discharge Exam: Filed Vitals:   06/22/13 0525  BP: 121/67  Pulse: 79  Temp: 98.5 F (36.9 C)  Resp: 18    General: NAD Cardiovascular: RRR Respiratory: CTAB  Discharge Instructions  Discharge Orders   Future Orders Complete By Expires   Diet general  As directed    Scheduling Instructions:     Low residue diet   Discharge instructions  As directed    Comments:     Follow up with Shirline Frees, MD in 1 week.   Increase activity slowly  As directed        Medication List         aspirin 81 MG tablet  Take 81 mg by mouth every other day. Pt takes on mon wed and fri     BESIVANCE 0.6 % Susp  Generic drug:  Besifloxacin HCl  Place 1 drop into the right eye 3 (three) times daily.     ciprofloxacin 500 MG tablet  Commonly known as:  CIPRO  Take 1 tablet (500 mg total) by mouth 2 (two) times daily. Take for 7 days then stop.     DUREZOL 0.05 % Emul  Generic drug:  Difluprednate  Place 1 drop into the right eye 3 (three) times daily.     estradiol 0.5 MG tablet  Commonly known as:  ESTRACE  Take 0.5 mg by mouth daily.     ILEVRO 0.3 % Susp  Generic drug:  Nepafenac  Place 1 drop into the right eye at bedtime.     loratadine 10 MG tablet  Commonly known as:  CLARITIN  Take 10 mg by mouth daily.     losartan-hydrochlorothiazide 100-12.5 MG per tablet  Commonly known as:  HYZAAR  Take 1 tablet by mouth daily. Resume in 2-3 days  Start taking on:  06/24/2013     metroNIDAZOLE 500 MG tablet  Commonly known as:  FLAGYL   Take 1 tablet (500 mg total) by mouth every 8 (eight) hours. Take for 7 days then stop.     multivitamin with minerals tablet  Take 1 tablet by mouth daily.       Allergies  Allergen Reactions  . Penicillins Other (See Comments)    Pt felt like she was going to die        Follow-up Information   Follow up with Shirline Frees, MD. Schedule an appointment as soon as possible for a visit in 1 week.   Specialty:  Family Medicine   Contact information:   [address], [place] [address] [phone]        The results of significant diagnostics from this hospitalization (including imaging, microbiology, ancillary and laboratory) are listed below for reference.    Significant Diagnostic Studies: Dg Abd 1 View  06/18/2013   CLINICAL DATA:  Abdominal pain.  EXAM: ABDOMEN - 1 VIEW  COMPARISON:  None.  FINDINGS: Single erect view was performed per ordering  physician. There is no evidence of free intraperitoneal air on this study. No dilated bowel loops seen in the upper abdomen. A 6 mm radiodensity is seen overlying left kidney, suspicious for a left renal calculus.  IMPRESSION: No evidence of free intraperitoneal air. Probable left renal calculus.   Electronically Signed   By: Earle Gell M.D.   On: 06/18/2013 09:54   Ct Abdomen Pelvis W Contrast  06/18/2013   CLINICAL DATA:  Abdominal pain and elevated white blood cell count  EXAM: CT ABDOMEN AND PELVIS WITH CONTRAST  TECHNIQUE: Multidetector CT imaging of the abdomen and pelvis was performed using the standard protocol following bolus administration of intravenous contrast.  CONTRAST:  3mL OMNIPAQUE IOHEXOL 300 MG/ML SOLN, 161mL OMNIPAQUE IOHEXOL 300 MG/ML SOLN  COMPARISON:  None.  FINDINGS: The lung bases are clear. No pleural or pericardial effusion identified.  No focal liver abnormality identified. The gallbladder appears normal. No biliary dilatation. Normal appearance of the pancreas. The spleen appears normal.   The adrenal glands are both within normal limits. The right kidney appears normal. Left renal calculi are identified. The largest stone is in the inferior pole of the left kidney measuring 6 mm, image number 36/series 2. The urinary bladder appears normal. Previous hysterectomy. Cystic structure within the right adnexa measures 4.9 cm, image 70/series 2. This contains a thin internal area of septation, image 54/series 5.  There is calcified atherosclerotic disease involving the abdominal aorta. No aneurysm. No upper abdominal adenopathy identified. There is no pelvic or inguinal adenopathy noted.  The stomach appears normal. The small bowel loops have a normal caliber without obstruction. The proximal colon appears normal. Abnormal wall thickening and inflammation involves the sigmoid colon. There is no perforation or abscess.  IMPRESSION: 1. Abnormal wall thickening and inflammation involving the sigmoid colon compatible with either segmental colitis or diverticulitis. The patient does not have many diverticula and there is no evidence for a perforated diverticula which favors inflammatory or infectious segmental colitis. 2. Mildly complicated cyst arising from the right adnexa. Recommend further assessment with pelvic sonogram. 3. Left renal calculi 4. Atherosclerotic disease.   Electronically Signed   By: Kerby Moors M.D.   On: 06/18/2013 11:45    Microbiology: Recent Results (from the past 240 hour(s))  CLOSTRIDIUM DIFFICILE BY PCR     Status: None   Collection Time    06/19/13 10:45 AM      Result Value Ref Range Status   C difficile by pcr NEGATIVE  NEGATIVE Final  STOOL CULTURE     Status: None   Collection Time    06/19/13 10:45 AM      Result Value Ref Range Status   Specimen Description STOOL   Final   Special Requests NONE   Final   Culture     Final   Value: NO SUSPICIOUS COLONIES, CONTINUING TO HOLD     Performed at [facility]   Report Status PENDING   Incomplete      Labs: Basic Metabolic Panel:  Recent Labs Lab 06/18/13 0920 06/19/13 0605 06/22/13 0550  NA 141 140 142  K 3.9 3.7 3.8  CL 99 101 106  CO2 27 27 24   GLUCOSE 120* 83 97  BUN 20 12 10   CREATININE 0.80 0.83 0.81  CALCIUM 9.8 9.1 8.4   Liver Function Tests:  Recent Labs Lab 06/18/13 0920  AST 21  ALT 12  ALKPHOS 70  BILITOT 0.5  PROT 7.8  ALBUMIN 3.9  No results found for this basename: LIPASE, AMYLASE,  in the last 168 hours No results found for this basename: AMMONIA,  in the last 168 hours CBC:  Recent Labs Lab 06/18/13 0920 06/19/13 0605 06/20/13 0540 06/21/13 0323 06/22/13 0550  WBC 18.3* 14.4* 13.5* 11.8* 11.7*  NEUTROABS 13.0* 9.1*  --   --   --   HGB 13.4 12.3 11.6* 11.0* 11.5*  HCT 40.6 36.5 34.4* 32.6* 35.2*  MCV 92.5 92.4 92.2 92.1 92.1  PLT 211 173 164 162 186   Cardiac Enzymes: No results found for this basename: CKTOTAL, CKMB, CKMBINDEX, TROPONINI,  in the last 168 hours BNP: BNP (last 3 results) No results found for this basename: PROBNP,  in the last 8760 hours CBG:  Recent Labs Lab 06/20/13 0750  GLUCAP 92       Signed:  Jeyden Coffelt MD [facility] 06/22/2013, 11:42 AM

## 2013-06-23 LAB — STOOL CULTURE

## 2013-06-30 DIAGNOSIS — I10 Essential (primary) hypertension: Secondary | ICD-10-CM | POA: Diagnosis not present

## 2013-06-30 DIAGNOSIS — K625 Hemorrhage of anus and rectum: Secondary | ICD-10-CM | POA: Diagnosis not present

## 2013-07-12 DIAGNOSIS — R9389 Abnormal findings on diagnostic imaging of other specified body structures: Secondary | ICD-10-CM | POA: Diagnosis not present

## 2013-07-12 DIAGNOSIS — K625 Hemorrhage of anus and rectum: Secondary | ICD-10-CM | POA: Diagnosis not present

## 2013-07-17 DIAGNOSIS — H2510 Age-related nuclear cataract, unspecified eye: Secondary | ICD-10-CM | POA: Diagnosis not present

## 2013-07-17 DIAGNOSIS — H269 Unspecified cataract: Secondary | ICD-10-CM | POA: Diagnosis not present

## 2013-07-18 DIAGNOSIS — H2510 Age-related nuclear cataract, unspecified eye: Secondary | ICD-10-CM | POA: Diagnosis not present

## 2013-08-10 DIAGNOSIS — N951 Menopausal and female climacteric states: Secondary | ICD-10-CM | POA: Diagnosis not present

## 2013-08-10 DIAGNOSIS — I10 Essential (primary) hypertension: Secondary | ICD-10-CM | POA: Diagnosis not present

## 2013-08-10 DIAGNOSIS — E785 Hyperlipidemia, unspecified: Secondary | ICD-10-CM | POA: Diagnosis not present

## 2013-08-17 DIAGNOSIS — D126 Benign neoplasm of colon, unspecified: Secondary | ICD-10-CM | POA: Diagnosis not present

## 2013-08-17 DIAGNOSIS — R933 Abnormal findings on diagnostic imaging of other parts of digestive tract: Secondary | ICD-10-CM | POA: Diagnosis not present

## 2013-08-21 DIAGNOSIS — H2510 Age-related nuclear cataract, unspecified eye: Secondary | ICD-10-CM | POA: Diagnosis not present

## 2013-08-21 DIAGNOSIS — H269 Unspecified cataract: Secondary | ICD-10-CM | POA: Diagnosis not present

## 2014-02-09 DIAGNOSIS — Z23 Encounter for immunization: Secondary | ICD-10-CM | POA: Diagnosis not present

## 2014-02-09 DIAGNOSIS — I10 Essential (primary) hypertension: Secondary | ICD-10-CM | POA: Diagnosis not present

## 2014-02-09 DIAGNOSIS — E785 Hyperlipidemia, unspecified: Secondary | ICD-10-CM | POA: Diagnosis not present

## 2014-02-09 DIAGNOSIS — L219 Seborrheic dermatitis, unspecified: Secondary | ICD-10-CM | POA: Diagnosis not present

## 2014-02-09 DIAGNOSIS — N951 Menopausal and female climacteric states: Secondary | ICD-10-CM | POA: Diagnosis not present

## 2014-03-05 DIAGNOSIS — Z1231 Encounter for screening mammogram for malignant neoplasm of breast: Secondary | ICD-10-CM | POA: Diagnosis not present

## 2014-03-05 DIAGNOSIS — Z780 Asymptomatic menopausal state: Secondary | ICD-10-CM | POA: Diagnosis not present

## 2014-08-13 DIAGNOSIS — N951 Menopausal and female climacteric states: Secondary | ICD-10-CM | POA: Diagnosis not present

## 2014-08-13 DIAGNOSIS — I10 Essential (primary) hypertension: Secondary | ICD-10-CM | POA: Diagnosis not present

## 2014-08-13 DIAGNOSIS — E785 Hyperlipidemia, unspecified: Secondary | ICD-10-CM | POA: Diagnosis not present

## 2014-09-28 DIAGNOSIS — H3531 Nonexudative age-related macular degeneration: Secondary | ICD-10-CM | POA: Diagnosis not present

## 2014-09-28 DIAGNOSIS — H52223 Regular astigmatism, bilateral: Secondary | ICD-10-CM | POA: Diagnosis not present

## 2014-09-28 DIAGNOSIS — Z961 Presence of intraocular lens: Secondary | ICD-10-CM | POA: Diagnosis not present

## 2014-12-06 DIAGNOSIS — L719 Rosacea, unspecified: Secondary | ICD-10-CM | POA: Diagnosis not present

## 2015-02-04 DIAGNOSIS — L719 Rosacea, unspecified: Secondary | ICD-10-CM | POA: Diagnosis not present

## 2015-02-04 DIAGNOSIS — L821 Other seborrheic keratosis: Secondary | ICD-10-CM | POA: Diagnosis not present

## 2015-02-04 DIAGNOSIS — D1801 Hemangioma of skin and subcutaneous tissue: Secondary | ICD-10-CM | POA: Diagnosis not present

## 2015-02-04 DIAGNOSIS — Z85828 Personal history of other malignant neoplasm of skin: Secondary | ICD-10-CM | POA: Diagnosis not present

## 2015-02-04 DIAGNOSIS — L814 Other melanin hyperpigmentation: Secondary | ICD-10-CM | POA: Diagnosis not present

## 2015-02-12 DIAGNOSIS — E785 Hyperlipidemia, unspecified: Secondary | ICD-10-CM | POA: Diagnosis not present

## 2015-02-12 DIAGNOSIS — N951 Menopausal and female climacteric states: Secondary | ICD-10-CM | POA: Diagnosis not present

## 2015-02-12 DIAGNOSIS — L719 Rosacea, unspecified: Secondary | ICD-10-CM | POA: Diagnosis not present

## 2015-02-12 DIAGNOSIS — Z23 Encounter for immunization: Secondary | ICD-10-CM | POA: Diagnosis not present

## 2015-02-12 DIAGNOSIS — I10 Essential (primary) hypertension: Secondary | ICD-10-CM | POA: Diagnosis not present

## 2015-05-24 DIAGNOSIS — L03811 Cellulitis of head [any part, except face]: Secondary | ICD-10-CM | POA: Diagnosis not present

## 2015-05-24 DIAGNOSIS — I10 Essential (primary) hypertension: Secondary | ICD-10-CM | POA: Diagnosis not present

## 2015-08-13 DIAGNOSIS — I10 Essential (primary) hypertension: Secondary | ICD-10-CM | POA: Diagnosis not present

## 2015-08-13 DIAGNOSIS — E785 Hyperlipidemia, unspecified: Secondary | ICD-10-CM | POA: Diagnosis not present

## 2015-08-13 DIAGNOSIS — L719 Rosacea, unspecified: Secondary | ICD-10-CM | POA: Diagnosis not present

## 2015-08-13 DIAGNOSIS — N951 Menopausal and female climacteric states: Secondary | ICD-10-CM | POA: Diagnosis not present

## 2015-08-22 DIAGNOSIS — E785 Hyperlipidemia, unspecified: Secondary | ICD-10-CM | POA: Diagnosis not present

## 2015-10-08 DIAGNOSIS — H43393 Other vitreous opacities, bilateral: Secondary | ICD-10-CM | POA: Diagnosis not present

## 2015-10-08 DIAGNOSIS — Z961 Presence of intraocular lens: Secondary | ICD-10-CM | POA: Diagnosis not present

## 2015-10-08 DIAGNOSIS — H52223 Regular astigmatism, bilateral: Secondary | ICD-10-CM | POA: Diagnosis not present

## 2015-10-08 DIAGNOSIS — H5203 Hypermetropia, bilateral: Secondary | ICD-10-CM | POA: Diagnosis not present

## 2016-02-12 DIAGNOSIS — Z23 Encounter for immunization: Secondary | ICD-10-CM | POA: Diagnosis not present

## 2016-02-12 DIAGNOSIS — E785 Hyperlipidemia, unspecified: Secondary | ICD-10-CM | POA: Diagnosis not present

## 2016-02-12 DIAGNOSIS — L719 Rosacea, unspecified: Secondary | ICD-10-CM | POA: Diagnosis not present

## 2016-02-12 DIAGNOSIS — N951 Menopausal and female climacteric states: Secondary | ICD-10-CM | POA: Diagnosis not present

## 2016-02-12 DIAGNOSIS — I10 Essential (primary) hypertension: Secondary | ICD-10-CM | POA: Diagnosis not present

## 2016-08-12 DIAGNOSIS — N951 Menopausal and female climacteric states: Secondary | ICD-10-CM | POA: Diagnosis not present

## 2016-08-12 DIAGNOSIS — I10 Essential (primary) hypertension: Secondary | ICD-10-CM | POA: Diagnosis not present

## 2016-08-12 DIAGNOSIS — E785 Hyperlipidemia, unspecified: Secondary | ICD-10-CM | POA: Diagnosis not present

## 2016-08-12 DIAGNOSIS — L719 Rosacea, unspecified: Secondary | ICD-10-CM | POA: Diagnosis not present

## 2016-10-08 DIAGNOSIS — H52223 Regular astigmatism, bilateral: Secondary | ICD-10-CM | POA: Diagnosis not present

## 2016-10-08 DIAGNOSIS — H5213 Myopia, bilateral: Secondary | ICD-10-CM | POA: Diagnosis not present

## 2016-10-08 DIAGNOSIS — Z961 Presence of intraocular lens: Secondary | ICD-10-CM | POA: Diagnosis not present

## 2016-10-27 DIAGNOSIS — J029 Acute pharyngitis, unspecified: Secondary | ICD-10-CM | POA: Diagnosis not present

## 2017-02-15 DIAGNOSIS — Z23 Encounter for immunization: Secondary | ICD-10-CM | POA: Diagnosis not present

## 2017-02-15 DIAGNOSIS — N951 Menopausal and female climacteric states: Secondary | ICD-10-CM | POA: Diagnosis not present

## 2017-02-15 DIAGNOSIS — Z1389 Encounter for screening for other disorder: Secondary | ICD-10-CM | POA: Diagnosis not present

## 2017-02-15 DIAGNOSIS — E785 Hyperlipidemia, unspecified: Secondary | ICD-10-CM | POA: Diagnosis not present

## 2017-02-15 DIAGNOSIS — L719 Rosacea, unspecified: Secondary | ICD-10-CM | POA: Diagnosis not present

## 2017-02-15 DIAGNOSIS — I10 Essential (primary) hypertension: Secondary | ICD-10-CM | POA: Diagnosis not present

## 2017-08-10 DIAGNOSIS — L814 Other melanin hyperpigmentation: Secondary | ICD-10-CM | POA: Diagnosis not present

## 2017-08-10 DIAGNOSIS — D1801 Hemangioma of skin and subcutaneous tissue: Secondary | ICD-10-CM | POA: Diagnosis not present

## 2017-08-10 DIAGNOSIS — L719 Rosacea, unspecified: Secondary | ICD-10-CM | POA: Diagnosis not present

## 2017-08-10 DIAGNOSIS — Z85828 Personal history of other malignant neoplasm of skin: Secondary | ICD-10-CM | POA: Diagnosis not present

## 2017-08-10 DIAGNOSIS — L821 Other seborrheic keratosis: Secondary | ICD-10-CM | POA: Diagnosis not present

## 2017-08-18 DIAGNOSIS — E7800 Pure hypercholesterolemia, unspecified: Secondary | ICD-10-CM | POA: Diagnosis not present

## 2017-08-18 DIAGNOSIS — I10 Essential (primary) hypertension: Secondary | ICD-10-CM | POA: Diagnosis not present

## 2017-08-18 DIAGNOSIS — N390 Urinary tract infection, site not specified: Secondary | ICD-10-CM | POA: Diagnosis not present

## 2017-08-18 DIAGNOSIS — L719 Rosacea, unspecified: Secondary | ICD-10-CM | POA: Diagnosis not present

## 2017-08-18 DIAGNOSIS — N951 Menopausal and female climacteric states: Secondary | ICD-10-CM | POA: Diagnosis not present

## 2017-09-07 DIAGNOSIS — I10 Essential (primary) hypertension: Secondary | ICD-10-CM | POA: Diagnosis not present

## 2017-09-07 DIAGNOSIS — R42 Dizziness and giddiness: Secondary | ICD-10-CM | POA: Diagnosis not present

## 2017-10-13 DIAGNOSIS — H26493 Other secondary cataract, bilateral: Secondary | ICD-10-CM | POA: Diagnosis not present

## 2017-10-13 DIAGNOSIS — Z961 Presence of intraocular lens: Secondary | ICD-10-CM | POA: Diagnosis not present

## 2017-10-13 DIAGNOSIS — H5213 Myopia, bilateral: Secondary | ICD-10-CM | POA: Diagnosis not present

## 2017-10-13 DIAGNOSIS — D3132 Benign neoplasm of left choroid: Secondary | ICD-10-CM | POA: Diagnosis not present

## 2017-10-13 DIAGNOSIS — H52223 Regular astigmatism, bilateral: Secondary | ICD-10-CM | POA: Diagnosis not present

## 2017-10-13 DIAGNOSIS — H353121 Nonexudative age-related macular degeneration, left eye, early dry stage: Secondary | ICD-10-CM | POA: Diagnosis not present

## 2017-10-13 DIAGNOSIS — H353111 Nonexudative age-related macular degeneration, right eye, early dry stage: Secondary | ICD-10-CM | POA: Diagnosis not present

## 2017-10-21 ENCOUNTER — Other Ambulatory Visit: Payer: Self-pay | Admitting: Family Medicine

## 2017-10-21 DIAGNOSIS — N3001 Acute cystitis with hematuria: Secondary | ICD-10-CM | POA: Diagnosis not present

## 2017-10-21 DIAGNOSIS — M545 Low back pain: Secondary | ICD-10-CM

## 2017-10-21 DIAGNOSIS — R1031 Right lower quadrant pain: Secondary | ICD-10-CM

## 2017-10-22 ENCOUNTER — Other Ambulatory Visit: Payer: Self-pay | Admitting: Family Medicine

## 2017-10-22 ENCOUNTER — Ambulatory Visit
Admission: RE | Admit: 2017-10-22 | Discharge: 2017-10-22 | Disposition: A | Discharge status: Home / Self Care | Payer: PPO | Source: Ambulatory Visit | Attending: Family Medicine | Admitting: Family Medicine

## 2017-10-22 DIAGNOSIS — R1031 Right lower quadrant pain: Secondary | ICD-10-CM

## 2017-10-22 DIAGNOSIS — M545 Low back pain: Secondary | ICD-10-CM

## 2017-10-22 DIAGNOSIS — N9489 Other specified conditions associated with female genital organs and menstrual cycle: Secondary | ICD-10-CM

## 2017-10-22 DIAGNOSIS — N202 Calculus of kidney with calculus of ureter: Secondary | ICD-10-CM | POA: Diagnosis not present

## 2017-10-25 ENCOUNTER — Ambulatory Visit
Admission: RE | Admit: 2017-10-25 | Discharge: 2017-10-25 | Disposition: A | Discharge status: Home / Self Care | Payer: PPO | Source: Ambulatory Visit | Attending: Family Medicine | Admitting: Family Medicine

## 2017-10-25 DIAGNOSIS — N9489 Other specified conditions associated with female genital organs and menstrual cycle: Secondary | ICD-10-CM

## 2017-10-25 DIAGNOSIS — R310 Gross hematuria: Secondary | ICD-10-CM | POA: Diagnosis not present

## 2017-10-25 DIAGNOSIS — N202 Calculus of kidney with calculus of ureter: Secondary | ICD-10-CM | POA: Diagnosis not present

## 2017-10-25 DIAGNOSIS — N83201 Unspecified ovarian cyst, right side: Secondary | ICD-10-CM | POA: Diagnosis not present

## 2017-10-26 ENCOUNTER — Other Ambulatory Visit: Payer: Self-pay | Admitting: Urology

## 2017-11-10 ENCOUNTER — Other Ambulatory Visit: Payer: Self-pay

## 2017-11-10 ENCOUNTER — Encounter (HOSPITAL_BASED_OUTPATIENT_CLINIC_OR_DEPARTMENT_OTHER): Payer: Self-pay | Admitting: *Deleted

## 2017-11-10 NOTE — Progress Notes (Signed)
Spoke w/ pt via phone for pre-op interview.  Npo after mn.  Arrive at 1015.  Needs istat 8 and ekg.

## 2017-11-12 ENCOUNTER — Ambulatory Visit (HOSPITAL_BASED_OUTPATIENT_CLINIC_OR_DEPARTMENT_OTHER): Payer: PPO | Admitting: Anesthesiology

## 2017-11-12 ENCOUNTER — Ambulatory Visit (HOSPITAL_BASED_OUTPATIENT_CLINIC_OR_DEPARTMENT_OTHER)
Admission: RE | Admit: 2017-11-12 | Discharge: 2017-11-12 | Disposition: A | Discharge status: Home / Self Care | Payer: PPO | Source: Ambulatory Visit | Attending: Urology | Admitting: Urology

## 2017-11-12 ENCOUNTER — Encounter (HOSPITAL_BASED_OUTPATIENT_CLINIC_OR_DEPARTMENT_OTHER): Payer: Self-pay

## 2017-11-12 ENCOUNTER — Encounter (HOSPITAL_BASED_OUTPATIENT_CLINIC_OR_DEPARTMENT_OTHER)
Admission: RE | Disposition: A | Discharge status: Home / Self Care | Payer: Self-pay | Source: Ambulatory Visit | Attending: Urology

## 2017-11-12 ENCOUNTER — Other Ambulatory Visit: Payer: Self-pay

## 2017-11-12 DIAGNOSIS — Z79899 Other long term (current) drug therapy: Secondary | ICD-10-CM | POA: Diagnosis not present

## 2017-11-12 DIAGNOSIS — I10 Essential (primary) hypertension: Secondary | ICD-10-CM | POA: Diagnosis not present

## 2017-11-12 DIAGNOSIS — Z7951 Long term (current) use of inhaled steroids: Secondary | ICD-10-CM | POA: Insufficient documentation

## 2017-11-12 DIAGNOSIS — Z880 Allergy status to penicillin: Secondary | ICD-10-CM | POA: Diagnosis not present

## 2017-11-12 DIAGNOSIS — R319 Hematuria, unspecified: Secondary | ICD-10-CM | POA: Insufficient documentation

## 2017-11-12 DIAGNOSIS — Z7982 Long term (current) use of aspirin: Secondary | ICD-10-CM | POA: Diagnosis not present

## 2017-11-12 DIAGNOSIS — Z79890 Hormone replacement therapy (postmenopausal): Secondary | ICD-10-CM | POA: Diagnosis not present

## 2017-11-12 DIAGNOSIS — K529 Noninfective gastroenteritis and colitis, unspecified: Secondary | ICD-10-CM | POA: Diagnosis not present

## 2017-11-12 DIAGNOSIS — N202 Calculus of kidney with calculus of ureter: Secondary | ICD-10-CM | POA: Diagnosis not present

## 2017-11-12 DIAGNOSIS — N201 Calculus of ureter: Secondary | ICD-10-CM | POA: Diagnosis not present

## 2017-11-12 HISTORY — PX: HOLMIUM LASER APPLICATION: SHX5852

## 2017-11-12 HISTORY — DX: Calculus of kidney: N20.0

## 2017-11-12 HISTORY — DX: Personal history of other diseases of the digestive system: Z87.19

## 2017-11-12 HISTORY — DX: Calculus of ureter: N20.1

## 2017-11-12 HISTORY — PX: CYSTOSCOPY WITH RETROGRADE PYELOGRAM, URETEROSCOPY AND STENT PLACEMENT: SHX5789

## 2017-11-12 LAB — POCT I-STAT, CHEM 8
BUN: 20 mg/dL (ref 8–23)
CALCIUM ION: 1.28 mmol/L (ref 1.15–1.40)
Chloride: 101 mmol/L (ref 98–111)
Creatinine, Ser: 1.5 mg/dL — ABNORMAL HIGH (ref 0.44–1.00)
GLUCOSE: 106 mg/dL — AB (ref 70–99)
HCT: 37 % (ref 36.0–46.0)
Hemoglobin: 12.6 g/dL (ref 12.0–15.0)
Potassium: 3.6 mmol/L (ref 3.5–5.1)
Sodium: 140 mmol/L (ref 135–145)
TCO2: 27 mmol/L (ref 22–32)

## 2017-11-12 SURGERY — CYSTOURETEROSCOPY, WITH RETROGRADE PYELOGRAM AND STENT INSERTION
Anesthesia: General | Site: Ureter | Laterality: Left

## 2017-11-12 MED ORDER — TRAMADOL HCL 50 MG PO TABS
ORAL_TABLET | ORAL | Status: AC
  Filled 2017-11-12: qty 1

## 2017-11-12 MED ORDER — ONDANSETRON HCL 4 MG/2ML IJ SOLN
INTRAMUSCULAR | Status: AC
  Filled 2017-11-12: qty 2

## 2017-11-12 MED ORDER — FENTANYL CITRATE (PF) 100 MCG/2ML IJ SOLN
INTRAMUSCULAR | Status: AC
  Filled 2017-11-12: qty 2

## 2017-11-12 MED ORDER — FENTANYL CITRATE (PF) 100 MCG/2ML IJ SOLN
INTRAMUSCULAR | Status: DC | PRN
  Administered 2017-11-12: 50 ug via INTRAVENOUS
  Administered 2017-11-12: 25 ug via INTRAVENOUS

## 2017-11-12 MED ORDER — IOHEXOL 300 MG/ML  SOLN
INTRAMUSCULAR | Status: DC | PRN
  Administered 2017-11-12: 25 mL

## 2017-11-12 MED ORDER — GENTAMICIN SULFATE 40 MG/ML IJ SOLN
5.0000 mg/kg | INTRAMUSCULAR | Status: AC
  Administered 2017-11-12: 300 mg via INTRAVENOUS
  Filled 2017-11-12 (×2): qty 7.5

## 2017-11-12 MED ORDER — LIDOCAINE 2% (20 MG/ML) 5 ML SYRINGE
INTRAMUSCULAR | Status: DC | PRN
  Administered 2017-11-12: 50 mg via INTRAVENOUS

## 2017-11-12 MED ORDER — PROPOFOL 10 MG/ML IV BOLUS
INTRAVENOUS | Status: DC | PRN
  Administered 2017-11-12: 150 mg via INTRAVENOUS

## 2017-11-12 MED ORDER — TRAMADOL HCL 50 MG PO TABS: 50.0000 mg | ORAL_TABLET | Freq: Four times a day (QID) | ORAL | 0 refills | Status: DC | PRN

## 2017-11-12 MED ORDER — ONDANSETRON HCL 4 MG/2ML IJ SOLN
INTRAMUSCULAR | Status: DC | PRN
  Administered 2017-11-12: 4 mg via INTRAVENOUS

## 2017-11-12 MED ORDER — OXYCODONE HCL 5 MG PO TABS
5.0000 mg | ORAL_TABLET | Freq: Once | ORAL | Status: DC | PRN
  Filled 2017-11-12: qty 1

## 2017-11-12 MED ORDER — PHENYLEPHRINE 40 MCG/ML (10ML) SYRINGE FOR IV PUSH (FOR BLOOD PRESSURE SUPPORT)
PREFILLED_SYRINGE | INTRAVENOUS | Status: DC | PRN
  Administered 2017-11-12: 80 ug via INTRAVENOUS

## 2017-11-12 MED ORDER — KETOROLAC TROMETHAMINE 10 MG PO TABS
10.0000 mg | ORAL_TABLET | Freq: Four times a day (QID) | ORAL | Status: DC
  Administered 2017-11-12: 10 mg via ORAL
  Filled 2017-11-12 (×2): qty 1

## 2017-11-12 MED ORDER — OXYCODONE HCL 5 MG/5ML PO SOLN
5.0000 mg | Freq: Once | ORAL | Status: DC | PRN
Start: 2017-11-12 — End: 2017-11-12
  Filled 2017-11-12: qty 5

## 2017-11-12 MED ORDER — LACTATED RINGERS IV SOLN
INTRAVENOUS | Status: DC
  Administered 2017-11-12 (×2): via INTRAVENOUS
  Filled 2017-11-12: qty 1000

## 2017-11-12 MED ORDER — FENTANYL CITRATE (PF) 100 MCG/2ML IJ SOLN
25.0000 ug | INTRAMUSCULAR | Status: DC | PRN
  Administered 2017-11-12 (×2): 50 ug via INTRAVENOUS
  Filled 2017-11-12: qty 1

## 2017-11-12 MED ORDER — KETOROLAC TROMETHAMINE 10 MG PO TABS: 10.0000 mg | ORAL_TABLET | Freq: Four times a day (QID) | ORAL | 0 refills | Status: DC | PRN

## 2017-11-12 MED ORDER — SODIUM CHLORIDE 0.9 % IR SOLN
Status: DC | PRN
  Administered 2017-11-12: 3000 mL

## 2017-11-12 MED ORDER — SENNOSIDES-DOCUSATE SODIUM 8.6-50 MG PO TABS: 1.0000 | ORAL_TABLET | Freq: Two times a day (BID) | ORAL | 0 refills | Status: DC

## 2017-11-12 MED ORDER — SULFAMETHOXAZOLE-TRIMETHOPRIM 800-160 MG PO TABS: 1.0000 | ORAL_TABLET | Freq: Two times a day (BID) | ORAL | 0 refills | Status: DC

## 2017-11-12 MED ORDER — MEPERIDINE HCL 25 MG/ML IJ SOLN
6.2500 mg | INTRAMUSCULAR | Status: DC | PRN
  Filled 2017-11-12: qty 1

## 2017-11-12 MED ORDER — PROPOFOL 10 MG/ML IV BOLUS
INTRAVENOUS | Status: AC
  Filled 2017-11-12: qty 20

## 2017-11-12 MED ORDER — LIDOCAINE 2% (20 MG/ML) 5 ML SYRINGE
INTRAMUSCULAR | Status: AC
  Filled 2017-11-12: qty 5

## 2017-11-12 MED ORDER — PHENYLEPHRINE 40 MCG/ML (10ML) SYRINGE FOR IV PUSH (FOR BLOOD PRESSURE SUPPORT)
PREFILLED_SYRINGE | INTRAVENOUS | Status: AC
  Filled 2017-11-12: qty 10

## 2017-11-12 MED ORDER — DEXAMETHASONE SODIUM PHOSPHATE 10 MG/ML IJ SOLN
INTRAMUSCULAR | Status: DC | PRN
  Administered 2017-11-12: 10 mg via INTRAVENOUS

## 2017-11-12 MED ORDER — TRAMADOL HCL 50 MG PO TABS
50.0000 mg | ORAL_TABLET | Freq: Four times a day (QID) | ORAL | Status: DC | PRN
  Administered 2017-11-12: 50 mg via ORAL
  Filled 2017-11-12: qty 1

## 2017-11-12 SURGICAL SUPPLY — 28 items
BAG DRAIN URO-CYSTO SKYTR STRL (DRAIN) ×2 IMPLANT
BASKET LASER NITINOL 1.9FR (BASKET) ×2 IMPLANT
CATH INTERMIT  6FR 70CM (CATHETERS) ×2 IMPLANT
CLOTH BEACON ORANGE TIMEOUT ST (SAFETY) ×2 IMPLANT
FIBER LASER FLEXIVA 365 (UROLOGICAL SUPPLIES) IMPLANT
FIBER LASER TRAC TIP (UROLOGICAL SUPPLIES) ×2 IMPLANT
GLOVE BIO SURGEON STRL SZ7.5 (GLOVE) ×2 IMPLANT
GLOVE BIO SURGEON STRL SZ8 (GLOVE) ×2 IMPLANT
GLOVE BIOGEL PI IND STRL 8.5 (GLOVE) ×2 IMPLANT
GLOVE BIOGEL PI INDICATOR 8.5 (GLOVE) ×2
GOWN STRL REUS W/TWL LRG LVL3 (GOWN DISPOSABLE) ×2 IMPLANT
GOWN STRL REUS W/TWL XL LVL3 (GOWN DISPOSABLE) ×2 IMPLANT
GUIDEWIRE ANG ZIPWIRE 038X150 (WIRE) ×2 IMPLANT
GUIDEWIRE STR DUAL SENSOR (WIRE) ×4 IMPLANT
IV NS 1000ML (IV SOLUTION)
IV NS 1000ML BAXH (IV SOLUTION) IMPLANT
IV NS IRRIG 3000ML ARTHROMATIC (IV SOLUTION) ×2 IMPLANT
KIT TURNOVER CYSTO (KITS) ×2 IMPLANT
MANIFOLD NEPTUNE II (INSTRUMENTS) ×2 IMPLANT
NS IRRIG 500ML POUR BTL (IV SOLUTION) IMPLANT
PACK CYSTO (CUSTOM PROCEDURE TRAY) ×2 IMPLANT
SHEATH URETERAL 12FRX28CM (UROLOGICAL SUPPLIES) ×2 IMPLANT
STENT POLARIS 5FRX24 (STENTS) ×2 IMPLANT
SYR 10ML LL (SYRINGE) ×2 IMPLANT
TUBE CONNECTING 12X1/4 (SUCTIONS) ×2 IMPLANT
TUBE FEEDING 8FR 16IN STR KANG (MISCELLANEOUS) ×2 IMPLANT
TUBING UROLOGY SET (TUBING) ×2 IMPLANT
WATER STERILE IRR 500ML POUR (IV SOLUTION) ×2 IMPLANT

## 2017-11-12 NOTE — Anesthesia Procedure Notes (Signed)
Procedure Name: LMA Insertion Date/Time: 11/12/2017 11:27 AM Performed by: Bonney Aid, CRNA Pre-anesthesia Checklist: Patient identified, Emergency Drugs available, Suction available and Patient being monitored Patient Re-evaluated:Patient Re-evaluated prior to induction Oxygen Delivery Method: Circle system utilized Preoxygenation: Pre-oxygenation with 100% oxygen Induction Type: IV induction Ventilation: Mask ventilation without difficulty LMA: LMA inserted LMA Size: 4.0 Number of attempts: 1 Airway Equipment and Method: Bite block Placement Confirmation: positive ETCO2 Tube secured with: Tape Dental Injury: Teeth and Oropharynx as per pre-operative assessment

## 2017-11-12 NOTE — Anesthesia Preprocedure Evaluation (Signed)
Anesthesia Evaluation  Patient identified by MRN, date of birth, ID band Patient awake    Reviewed: Allergy & Precautions, NPO status , Patient's Chart, lab work & pertinent test results  Airway Mallampati: II  TM Distance: >3 FB Neck ROM: Full    Dental   Pulmonary neg pulmonary ROS,    Pulmonary exam normal breath sounds clear to auscultation       Cardiovascular hypertension, negative cardio ROS Normal cardiovascular exam Rhythm:Regular Rate:Normal     Neuro/Psych negative neurological ROS  negative psych ROS   GI/Hepatic negative GI ROS, Neg liver ROS,   Endo/Other  negative endocrine ROS  Renal/GU Renal disease     Musculoskeletal negative musculoskeletal ROS (+)   Abdominal   Peds  Hematology negative hematology ROS (+)   Anesthesia Other Findings   Reproductive/Obstetrics negative OB ROS                            Anesthesia Physical Anesthesia Plan  ASA: II  Anesthesia Plan: General   Post-op Pain Management:    Induction: Intravenous  PONV Risk Score and Plan: 4 or greater and Ondansetron, Dexamethasone and Treatment may vary due to age or medical condition  Airway Management Planned: LMA  Additional Equipment:   Intra-op Plan:   Post-operative Plan: Extubation in OR  Informed Consent: I have reviewed the patients History and Physical, chart, labs and discussed the procedure including the risks, benefits and alternatives for the proposed anesthesia with the patient or authorized representative who has indicated his/her understanding and acceptance.   Dental advisory given  Plan Discussed with: CRNA  Anesthesia Plan Comments:        Anesthesia Quick Evaluation

## 2017-11-12 NOTE — H&P (Signed)
Sandra Mullins is an 84 y.o. female.    Chief Complaint: Pre-op LEFT Ureteroscopic Stone Manipulatoin  HPI:   1 - Lt Ureteral Stones - Lt 54mm distal (700HU, SSD 12cm, near phleboliths) and Lt 26mm proximal stone by non-CT 09/2017 on eval flank pain and hematuria. NO contralateral stones.   2 - Gross Hematuria - Non- smoker. New gross hematuria 09/2017. Non-con CT with Left ureteral stones as per above. No cysto yet.   PMH sig for benign hyst, open appy. No ischemic CV diseas / blood thinners. Her PCP is Azalia Bilis MD with [facility].    Today "Laura-Lee" is seen to proceed with LEFT ureteroscopy and bilateral retrogrades for left ureteral stones and complete hematuria eva. NO inverval fevers. Most recent UA without infectious parameters.   Past Medical History:  Diagnosis Date  . History of lower GI bleeding 05/2013   due to colitis  . Hypertension   . Left ureteral stone   . Nephrolithiasis    bilateral nonobstructive per CT 10-22-2017    Past Surgical History:  Procedure Laterality Date  . ABDOMINAL HYSTERECTOMY  1970s   per pt retained ovaries  . APPENDECTOMY  age 49  . DILATION AND CURETTAGE OF UTERUS  x2  yrs ago    Family History  Problem Relation Age of Onset  . Lung cancer Mother   . COPD Father    Social History:  reports that she has never smoked. She has never used smokeless tobacco. She reports that she does not drink alcohol or use drugs.  Allergies:  Allergies  Allergen Reactions  . Penicillins Other (See Comments)    Pt felt like she was going to die     No medications prior to admission.    No results found for this or any previous visit (from the past 48 hour(s)). No results found.  Review of Systems  Constitutional: Negative.  Negative for chills and fever.  HENT: Negative.   Eyes: Negative.   Respiratory: Negative.   Cardiovascular: Negative.   Genitourinary: Positive for flank pain.  Skin: Negative.   Endo/Heme/Allergies: Negative.    Psychiatric/Behavioral: Negative.     Height 5\' 2"  (1.575 m), weight 72.6 kg (160 lb). Physical Exam  Constitutional: She appears well-developed.  HENT:  Head: Normocephalic.  Cardiovascular: Normal rate.  Respiratory: Effort normal.  GI: Soft.  Genitourinary:  Genitourinary Comments: Mild left CVAT at present.   Skin: Skin is warm.  Psychiatric: She has a normal mood and affect.     Assessment/Plan  Proceed as planned with LEFT ureteroscopic stone manipulatoin and bilateral retrogrades with goal of stone free. Risks, benefits, alternatives, expected peri-op course discussed previously and reiterated today   Alexis Frock, MD 11/12/2017, 9:29 AM

## 2017-11-12 NOTE — Op Note (Signed)
NAME: Sandra Mullins, Sandra Mullins MEDICAL RECORD DT:[id number] ACCOUNT [id number] DATE OF BIRTH:[birth year] FACILITY: [facility] LOCATION: [facility] PHYSICIAN:Onesti Bonfiglio Tresa Moore, MD  OPERATIVE REPORT  DATE OF PROCEDURE:  11/12/2017  PREOPERATIVE DIAGNOSIS:  Left renal and ureteral stones with refractory colic.  PROCEDURE PERFORMED: 1.  Cystoscopy with left retrograde pyelogram, interpretation. 2.  Left ureteroscopy, laser lithotripsy. 3.  Insertion of left ureteral stent 5 x 24 Polaris, no tether.  ESTIMATED BLOOD LOSS:  Nil.  COMPLICATIONS:  None.  SPECIMENS:  Left renal and ureteral stone fragments for analysis.  FINDINGS: 1.  Very impacted left distal ureteral stone. 2.  Left impacted left proximal ureteral stone and intrarenal stones. 3.  Significant amount of proteinaceous debris and formed blood clot within the left renal pelvis consistent with long-term obstruction. 4.  Successful placement of left ureteral stent proximal renal pelvis, distal end in urinary bladder. 5.  Complete resolution of all accessible stone fragments larger than 130 mm following laser lithotripsy and basket extraction.  INDICATION:  The patient is a very pleasant 84 year old lady who was found on workup of flank pain and hematuria to have multifocal left ureteral stones with a 6 mm distal stone, 4 mm proximal stone, as well as a smaller lower pole intrarenal stone.   Options were discussed for management including medical therapy versus shockwave lithotripsy versus ureteroscopy, and she wished to proceed with left ureteroscopy with goal of ipsilateral stone free.  Informed consent was obtained and placed in medical  record.  PROCEDURE IN DETAIL:  The patient being identified, procedure being left ureteroscopic stone manipulation was confirmed.  Procedure timeout was performed.  Intravenous antibiotics administered.  General anesthesia introduced.  The patient was placed into  a low lithotomy position and sterile field was  created by prepping and draping the patient's vagina, introitus and proximal thighs using iodine.  Next, cystourethroscopy was performed using a 22-French rigid cystoscope with offset lens.  Inspection of  the urinary bladder revealed no diverticula, calcifications, or papillary lesions.  The left ureteral orifice was cannulated with a 6-French end hole catheter and left retrograde pyelogram was obtained.  Left retrograde pyelogram demonstrated a single left ureter with single-system left kidney.  There was a significant filling defect in the distal ureter consistent with known stone.  There was scant contrast that was able to be injected proximal to this,  but enough to denote a single ureter to a very dilated renal pelvis with some debris within it.  A 0.038 ZIPwire was advanced to the lower pole and set aside as a safety wire.  An 8-French feeding tube was placed in the urinary bladder for pressure  release, and semirigid ureteroscopy was performed along a separate sensor working wire.  As expected, there was a dominant, quite impacted calcification in the distal ureter.  This appeared to be much too large for simple basketing.  Holmium laser energy  applied with settings of 0.3 joules and 30 Hz. It was fragmented into approximately 4 smaller pieces.  These were grasped on their long axis, sequentially removed, and set aside for composition analysis.  Semirigid ureteroscopy of the remainder of the  left ureter revealed no additional calcifications so that likely the more proximal stone had been retrograde positioned into the renal pelvis.  As the goal today was ipsilateral stone free, the semirigid scope was exchanged for a 12/14, 24 cm ureteral  access sheath over the sensor working wire to the level of the proximal ureter using continuous fluoroscopic guidance and flexible digital ureteroscopy performed  in the proximal ureter and systematic inspection of the left kidney.  There was a  significant  amount of proteinaceous and bloody debris within the left renal pelvis consistent with fairly long-term obstruction.  This was irrigated and set aside.  Also, as anticipated, there were multifocal intrarenal stones, mostly mid pole and lower  pole, consistent with retrograde positioning of prior ureteral stone and known lower pole stone.  The mid pole stone required additional laser lithotripsy fragmented into approximately 3 smaller pieces.  All of these stones were then sequentially grasped  with their long axis, removed, and set aside for composition analysis.  Following these maneuvers, excellent hemostasis.  No evidence of renal perforation.  All accessible stone fragments larger than 1 mm had been removed.  Notably, there was still some  proteinaceous debris and clot within the renal pelvis which made visualization somewhat poor.  As such, it was felt that interval stenting with a nontethered stent would be warranted to allow passage of this debris.  The access sheath was removed under  continuous vision.  No significant mucosal abnormalities were found, and a new 5 x 24 Polaris-type stent was placed with remaining safety wire using fluoroscopic guidance.  Good proximal and distal points were noted.  The bladder was empty per  cystoscope.  Procedure was then terminated.  The patient tolerated the procedure well.  No immediate perioperative complications.  The patient was taken to the post-anesthesia care in stable condition.  LN/NUANCE  D:11/12/2017 T:11/12/2017 JOB:[id number]

## 2017-11-12 NOTE — Anesthesia Postprocedure Evaluation (Signed)
Anesthesia Post Note  Patient: Sandra Mullins  Procedure(s) Performed: CYSTOSCOPY WITH RETROGRADE PYELOGRAM, URETEROSCOPY AND STENT PLACEMENT (Left Ureter) HOLMIUM LASER APPLICATION (Left Ureter)     Patient location during evaluation: PACU Anesthesia Type: General Level of consciousness: awake and alert Pain management: pain level controlled Vital Signs Assessment: post-procedure vital signs reviewed and stable Respiratory status: spontaneous breathing, nonlabored ventilation, respiratory function stable and patient connected to nasal cannula oxygen Cardiovascular status: blood pressure returned to baseline and stable Postop Assessment: no apparent nausea or vomiting Anesthetic complications: no    Last Vitals:  Vitals:   11/12/17 1400 11/12/17 1552  BP: 122/61 123/68  Pulse: 62 69  Resp: 14 16  Temp:  37.1 C  SpO2: 95% 94%    Last Pain:  Vitals:   11/12/17 1552  TempSrc: Oral  PainSc:                  Effie Berkshire

## 2017-11-12 NOTE — Discharge Instructions (Signed)
1 - You may have urinary urgency (bladder spasms) and bloody urine on / off with stent in place. This is normal.  2 - Call MD or go to ER for fever >102, severe pain / nausea / vomiting not relieved by medications, or acute change in medical status  [facility] [phone] Post Ureteroscopy With  Stent Instructions  Definitions:  Ureter: The duct that transports urine from the kidney to the bladder. Stent:   A plastic hollow tube that is placed into the ureter, from the kidney to the bladder to prevent the ureter from swelling shut.  GENERAL INSTRUCTIONS:  Despite the fact that no skin incisions were used, the area around the ureter and bladder is raw and irritated. The stent is a foreign body which will further irritate the bladder wall. This irritation is manifested by increased frequency of urination, both day and night, and by an increase in the urge to urinate. In some, the urge to urinate is present almost always. Sometimes the urge is strong enough that you may not be able to stop yourself from urinating. The only real cure is to remove the stent and then give time for the bladder wall to heal which can't be done until the danger of the ureter swelling shut has passed, which varies.  You may see some blood in your urine while the stent is in place and a few days afterwards. Do not be alarmed, even if the urine was clear for a while. Get off your feet and drink lots of fluids until clearing occurs. If you start to pass clots or don't improve, call us.  DIET: You may return to your normal diet immediately. Because of the raw surface of your bladder, alcohol, spicy foods, acid type foods and drinks with caffeine may cause irritation or frequency and should be used in moderation. To keep your urine flowing freely and to avoid constipation, drink plenty of fluids during the day ( 8-10 glasses ). Tip: Avoid cranberry juice because it is very acidic.  ACTIVITY: Your physical  activity doesn't need to be restricted. However, if you are very active, you may see some blood in your urine. We suggest that you reduce your activity under these circumstances until the bleeding has stopped.  BOWELS: It is important to keep your bowels regular during the postoperative period. Straining with bowel movements can cause bleeding. A bowel movement every other day is reasonable. Use a mild laxative if needed, such as Milk of Magnesia 2-3 tablespoons, or 2 Dulcolax tablets. Call if you continue to have problems. If you have been taking narcotics for pain, before, during or after your surgery, you may be constipated. Take a laxative if necessary.   MEDICATION: You should resume your pre-surgery medications unless told not to. In addition you will often be given an antibiotic to prevent infection. These should be taken as prescribed until the bottles are finished unless you are having an unusual reaction to one of the drugs.  PROBLEMS YOU SHOULD REPORT TO US: Fevers over 100.5 Fahrenheit. Heavy bleeding, or clots ( See above notes about blood in urine ). Inability to urinate. Drug reactions ( hives, rash, nausea, vomiting, diarrhea ). Severe burning or pain with urination that is not improving.  FOLLOW-UP: You will need a follow-up appointment to monitor your progress. Call for this appointment at the number listed above. Usually the first appointment will be about three to fourteen days after your surgery.   Post Anesthesia Home Care Instructions    Activity: Get plenty of rest for the remainder of the day. A responsible individual must stay with you for 24 hours following the procedure.  For the next 24 hours, DO NOT: -Drive a car -Operate machinery -Drink alcoholic beverages -Take any medication unless instructed by your physician -Make any legal decisions or sign important papers.  Meals: Start with liquid foods such as gelatin or soup. Progress to regular foods as  tolerated. Avoid greasy, spicy, heavy foods. If nausea and/or vomiting occur, drink only clear liquids until the nausea and/or vomiting subsides. Call your physician if vomiting continues.  Special Instructions/Symptoms: Your throat may feel dry or sore from the anesthesia or the breathing tube placed in your throat during surgery. If this causes discomfort, gargle with warm salt water. The discomfort should disappear within 24 hours.          

## 2017-11-12 NOTE — Brief Op Note (Signed)
11/12/2017  12:43 PM  PATIENT:  Sandra Mullins  84 y.o. female  PRE-OPERATIVE DIAGNOSIS:  LEFT URETERAL STONE, HEMATURIA  POST-OPERATIVE DIAGNOSIS:  LEFT URETERAL STONE, HEMATURIA  PROCEDURE:  Procedure(s): CYSTOSCOPY WITH RETROGRADE PYELOGRAM, URETEROSCOPY AND STENT PLACEMENT (Left) HOLMIUM LASER APPLICATION (Left)  SURGEON:  Surgeon(s) and Role:    Alexis Frock, MD - Primary  PHYSICIAN ASSISTANT:   ASSISTANTS: none   ANESTHESIA:   general  EBL:  minimal   BLOOD ADMINISTERED:none  DRAINS: none   LOCAL MEDICATIONS USED:  NONE  SPECIMEN:  Source of Specimen:  left renal / ureteral stone fragements  DISPOSITION OF SPECIMEN:  [facility] for compositional analysis  COUNTS:  YES  TOURNIQUET:  * No tourniquets in log *  DICTATION: .Other Dictation: Dictation Number [id number]  PLAN OF CARE: Discharge to home after PACU  PATIENT DISPOSITION:  PACU - hemodynamically stable.   Delay start of Pharmacological VTE agent (>24hrs) due to surgical blood loss or risk of bleeding: yes

## 2017-11-12 NOTE — Transfer of Care (Signed)
Immediate Anesthesia Transfer of Care Note  Patient: Sandra Mullins  Procedure(s) Performed: CYSTOSCOPY WITH RETROGRADE PYELOGRAM, URETEROSCOPY AND STENT PLACEMENT (Left Ureter) HOLMIUM LASER APPLICATION (Left Ureter)  Patient Location: PACU  Anesthesia Type:General  Level of Consciousness: drowsy and responds to stimulation  Airway & Oxygen Therapy: Patient Spontanous Breathing and Patient connected to nasal cannula oxygen  Post-op Assessment: Report given to RN  Post vital signs: Reviewed and stable  Last Vitals:  Vitals Value Taken Time  BP 131/67 11/12/2017 12:55 PM  Temp    Pulse 65 11/12/2017 12:56 PM  Resp 19 11/12/2017 12:56 PM  SpO2 97 % 11/12/2017 12:56 PM  Vitals shown include unvalidated device data.  Last Pain:  Vitals:   11/12/17 1009  TempSrc:   PainSc: 0-No pain         Complications: No apparent anesthesia complications

## 2017-11-15 ENCOUNTER — Encounter (HOSPITAL_BASED_OUTPATIENT_CLINIC_OR_DEPARTMENT_OTHER): Payer: Self-pay | Admitting: Urology

## 2017-11-15 DIAGNOSIS — N202 Calculus of kidney with calculus of ureter: Secondary | ICD-10-CM | POA: Diagnosis not present

## 2017-11-17 DIAGNOSIS — N83291 Other ovarian cyst, right side: Secondary | ICD-10-CM | POA: Diagnosis not present

## 2017-11-18 ENCOUNTER — Telehealth: Payer: Self-pay | Admitting: *Deleted

## 2017-11-18 NOTE — Telephone Encounter (Signed)
Called and spoke with the patient, gave appt for 7/29. Gave the patient appt times of 10:30 nursing and 11am MD. Called referring office that we have called the patient. Gave them appt date/time

## 2017-11-22 ENCOUNTER — Inpatient Hospital Stay: Payer: PPO | Attending: Obstetrics | Admitting: Obstetrics

## 2017-11-22 ENCOUNTER — Encounter: Payer: Self-pay | Admitting: Obstetrics

## 2017-11-22 VITALS — BP 148/64 | HR 68 | Temp 97.6°F | Resp 20 | Ht 62.0 in | Wt 155.0 lb

## 2017-11-22 DIAGNOSIS — Z90710 Acquired absence of both cervix and uterus: Secondary | ICD-10-CM | POA: Diagnosis not present

## 2017-11-22 DIAGNOSIS — N83201 Unspecified ovarian cyst, right side: Secondary | ICD-10-CM

## 2017-11-22 NOTE — Patient Instructions (Addendum)
Preparing for your Surgery  Plan for surgery on December 14, 2017 with Dr. Precious Haws at [place] will be scheduled for a robotic assisted bilateral salpingo-oophorectomy, possible laparotomy, possible staging.  Pre-operative Testing -You will receive a phone call from presurgical testing at [facility] to arrange for a pre-operative testing appointment before your surgery.  This appointment normally occurs one to two weeks before your scheduled surgery.   -Bring your insurance card, copy of an advanced directive if applicable, medication list  -At that visit, you will be asked to sign a consent for a possible blood transfusion in case a transfusion becomes necessary during surgery.  The need for a blood transfusion is rare but having consent is a necessary part of your care.     -You should not be taking blood thinners or aspirin at least ten days prior to surgery unless instructed by your surgeon.  Day Before Surgery at [place] will be asked to take in a light diet the day before surgery.  Avoid carbonated beverages.  You will be advised to have nothing to eat or drink after midnight the evening before.    Eat a light diet the day before surgery.  Examples including soups, broths, toast, yogurt, mashed potatoes.  Things to avoid include carbonated beverages (fizzy beverages), raw fruits and raw vegetables, or beans.   If your bowels are filled with gas, your surgeon will have difficulty visualizing your pelvic organs which increases your surgical risks.  Your role in recovery Your role is to become active as soon as directed by your doctor, while still giving yourself time to heal.  Rest when you feel tired. You will be asked to do the following in order to speed your recovery:  - Cough and breathe deeply. This helps toclear and expand your lungs and can prevent pneumonia. You may be given a spirometer to practice deep breathing. A staff member  will show you how to use the spirometer. - Do mild physical activity. Walking or moving your legs help your circulation and body functions return to normal. A staff member will help you when you try to walk and will provide you with simple exercises. Do not try to get up or walk alone the first time. - Actively manage your pain. Managing your pain lets you move in comfort. We will ask you to rate your pain on a scale of zero to 10. It is your responsibility to tell your doctor or nurse where and how much you hurt so your pain can be treated.  Special Considerations -If you are diabetic, you may be placed on insulin after surgery to have closer control over your blood sugars to promote healing and recovery.  This does not mean that you will be discharged on insulin.  If applicable, your oral antidiabetics will be resumed when you are tolerating a solid diet.  -Your final pathology results from surgery should be available by the Friday after surgery and the results will be relayed to you when available.  -Dr. Lahoma Crocker is the Surgeon that assists your GYN Oncologist with surgery.  The next day after your surgery you will either see your GYN Oncologist or Dr. Lahoma Crocker.   Blood Transfusion Information WHAT IS A BLOOD TRANSFUSION? A transfusion is the replacement of blood or some of its parts. Blood is made up of multiple cells which provide different functions.  Red blood cells carry oxygen and are used for blood loss replacement.  White blood cells fight against infection.  Platelets control bleeding.  Plasma helps clot blood.  Other blood products are available for specialized needs, such as hemophilia or other clotting disorders. BEFORE THE TRANSFUSION  Who gives blood for transfusions?   You may be able to donate blood to be used at a later date on yourself (autologous donation).  Relatives can be asked to donate blood. This is generally not any safer than if you have  received blood from a stranger. The same precautions are taken to ensure safety when a relative's blood is donated.  Healthy volunteers who are fully evaluated to make sure their blood is safe. This is blood bank blood. Transfusion therapy is the safest it has ever been in the practice of medicine. Before blood is taken from a donor, a complete history is taken to make sure that person has no history of diseases nor engages in risky social behavior (examples are intravenous drug use or sexual activity with multiple partners). The donor's travel history is screened to minimize risk of transmitting infections, such as malaria. The donated blood is tested for signs of infectious diseases, such as HIV and hepatitis. The blood is then tested to be sure it is compatible with you in order to minimize the chance of a transfusion reaction. If you or a relative donates blood, this is often done in anticipation of surgery and is not appropriate for emergency situations. It takes many days to process the donated blood. RISKS AND COMPLICATIONS Although transfusion therapy is very safe and saves many lives, the main dangers of transfusion include:   Getting an infectious disease.  Developing a transfusion reaction. This is an allergic reaction to something in the blood you were given. Every precaution is taken to prevent this. The decision to have a blood transfusion has been considered carefully by your caregiver before blood is given. Blood is not given unless the benefits outweigh the risks.

## 2017-11-25 ENCOUNTER — Telehealth: Payer: Self-pay

## 2017-11-25 DIAGNOSIS — N202 Calculus of kidney with calculus of ureter: Secondary | ICD-10-CM | POA: Diagnosis not present

## 2017-11-25 NOTE — Telephone Encounter (Signed)
ENCOUNTER OPENED IN ERROR

## 2017-11-29 ENCOUNTER — Encounter: Payer: Self-pay | Admitting: Obstetrics

## 2017-11-29 DIAGNOSIS — N83201 Unspecified ovarian cyst, right side: Secondary | ICD-10-CM | POA: Insufficient documentation

## 2017-11-29 NOTE — H&P (View-Only) (Signed)
Sandra Mullins at [facility]   Consult Note: New Patient FIRST VISIT   Consult was requested by Dr. Janyth Pupa   Chief Complaint  Patient presents with  . Right ovarian cyst    HPI: Sandra Mullins  is a very nice 84 y.o.  P2  She noted left flank pain radiating to her hip and about 4 days later this returned. She therefore followed up with her PCP. Imaging was obtained and she was found to have kidney stones. She saw urology who treated the stones and placed a left ureteral stent. The stent is due out this week that she is meeting me for the first time.  On imaging there was also noted to be an adnexal mass 8.5cm in the right. Follow-up ultrasound 10/25/17 revealed (absent uterus) a complex right adenxal "mass" 7x7x??0.5cm with multiple septations and mobile d3ebris within the cystic mass. Some vascularity along one portion of the mass.  She was seen by Dr.Ozan with Gyn. A ROMA was obtained with normal CA125 (7.1 on 7/25) and elevated HE4, but calculated normal postmenopausal ROMA.  Last colonoscopy was 2015 with next due 2020.  She notes some RLQ pain, described as dull. No N/V. Some loss of body weight about 10lb in 6 months but has had multiple "medical" issues including the flu that she attributes this to. Has a good appetite. Normal bladder/bowel function. No early satiety.  RE: PCN allergy. States occurred in her late 14's and felt ill with chest pressure. Unsure if ever has had Keflex.  Imported EPIC Oncologic History:   No history exists.    Measurement of disease:  CA125  . 11/18/17 = 7.1 (preop)  Radiology:  10/22/17 - CTAP - Lower chest: No acute abnormality. Aortic Atherosclerosis. Hepatobiliary: No focal liver abnormality is seen. No gallstones, gallbladder wall thickening, or biliary dilatation. Pancreas: Unremarkable. No pancreatic ductal dilatation or surrounding inflammatory changes. Spleen: Normal in size without focal  abnormality. Adrenals/Urinary Tract: Normal adrenal glands. 1 mm calcification in the lower pole rightrenal collecting system. 2 mm calcification in the lower pole left renal collecting system. Moderate left hydronephrosis and ureterectasis. Mild inflammatory/edematous changes around the left kidney and renal collecting system. There is a nonobstructing 4 mm UPJ calculus, and continuation of ureteral ureterectasis down to a 10 mm calculus at the level of the SI joint. Urinary bladder is nondistended. Stomach/Bowel: Stomach and small bowel are decompressed. Appendix surgically absent. The colon is nondilated. No focal wall thickening or adjacent inflammatory/edematous change. Vascular/Lymphatic: Heavy calcified aortoiliac atheromatous plaque without aneurysm. Bilateral pelvic phleboliths. No abdominal or pelvic adenopathy. Reproductive: Previous hysterectomy. 8.5 cm cystic right adnexal process with mural nodularity, previously 4.9 cm (compared to 05/2013 CT). Other: No ascites.  No free air. Musculoskeletal: Midline anterior abdominal wall sutures below the umbilicus. Left hip DJD. Degenerative disc disease L5-S1. Negative for fracture or worrisome bone lesion. IMPRESSION: 1. Obstructing 10 mm distal left ureteral calculus, and a second 4 mm calculus near the UPJ. 2. Bilateral nephrolithiasis 3. Enlarging 8.5 cm cystic right adnexal process. Recommend outpatient elective pelvic ultrasound for further characterization to exclude neoplasm.  10/25/17 - TVUS -  Uterus The uterus and endometrium are surgically absent. Right ovary The right ovary could not be visualized. Left ovary The left ovary could not be visualized. Other findings there is a complex appearing right adnexal mass measuring 7.0 x 7.0 by a 0.5 cm there are multiple septations with some daughter cyst type appearance. There is  some mobile debris within the cystic mass. There is some vascularity along one portion of the mass.  IMPRESSION: Abnormal complex  appearing cystic right adnexal mass worrisome for neoplasm either benign or malignant. Gynecological evaluation is recommended. The uterus is surgically absent. The ovaries could not be discretely identified.  Outpatient Encounter Medications as of 11/22/2017  Medication Sig  . estradiol (ESTRACE) 0.5 MG tablet Take 0.5 mg by mouth every morning.  . hydrochlorothiazide (MICROZIDE) 12.5 MG capsule Take 12.5 mg by mouth every morning.   Marland Kitchen losartan (COZAAR) 100 MG tablet Take 100 mg by mouth every morning.   . metroNIDAZOLE (METROGEL) 0.75 % gel Apply 1 application topically as needed.  . Multiple Vitamins-Minerals (WOMENS MULTI) CAPS Take 1 capsule by mouth daily.   . fluticasone (FLONASE) 50 MCG/ACT nasal spray Place into both nostrils as needed for allergies or rhinitis.  . [DISCONTINUED] ketorolac (TORADOL) 10 MG tablet Take 1 tablet (10 mg total) by mouth every 6 (six) hours as needed for moderate pain. Or stent discomfort post-operatively. Pt has tolerated prior. (Patient not taking: Reported on 11/22/2017)  . [DISCONTINUED] senna-docusate (SENOKOT-S) 8.6-50 MG tablet Take 1 tablet by mouth 2 (two) times daily. While taking strong pain meds to prevent constipation (Patient not taking: Reported on 11/22/2017)  . [DISCONTINUED] sulfamethoxazole-trimethoprim (BACTRIM DS,SEPTRA DS) 800-160 MG tablet Take 1 tablet by mouth 2 (two) times daily. X 3 days. Begin day before next Urology appointment. (Patient not taking: Reported on 11/22/2017)  . [DISCONTINUED] traMADol (ULTRAM) 50 MG tablet Take 1-2 tablets (50-100 mg total) by mouth every 6 (six) hours as needed for severe pain. Post-operatively (Patient not taking: Reported on 11/22/2017)   No facility-administered encounter medications on file as of 11/22/2017.    Allergies  Allergen Reactions  . Penicillins Other (See Comments)    Pt felt like she was going to die     Past Medical History:  Diagnosis Date  . History of lower GI bleeding 05/2013    due to colitis  . Hypertension   . Left ureteral stone   . Nephrolithiasis    bilateral nonobstructive per CT 10-22-2017   Past Surgical History:  Procedure Laterality Date  . ABDOMINAL HYSTERECTOMY  1970s   due to menorrhagia ; per pt retained ovaries  . APPENDECTOMY  age 30   Open appy. States no rupture  . CATARACT EXTRACTION     Unsure of the date  . CYSTOSCOPY WITH RETROGRADE PYELOGRAM, URETEROSCOPY AND STENT PLACEMENT Left 11/12/2017   Procedure: CYSTOSCOPY WITH RETROGRADE PYELOGRAM, URETEROSCOPY AND STENT PLACEMENT;  Surgeon: Alexis Frock, MD;  Location: [facility];  Service: Urology;  Laterality: Left;  . DILATION AND CURETTAGE OF UTERUS  x2  yrs ago  . HOLMIUM LASER APPLICATION Left 2/70/3500   Procedure: HOLMIUM LASER APPLICATION;  Surgeon: Alexis Frock, MD;  Location: [facility];  Service: Urology;  Laterality: Left;        Past Gynecological History:   GYNECOLOGIC HISTORY:  No LMP recorded. Patient has had a hysterectomy. . Menarche: 84 years old . P 2 . LMP Unsure . Contraceptive Prior OCP . HRT YES CURRENT  . Last Pap N/A - Hyst 1970's Family Hx:  Family History  Problem Relation Age of Onset  . Lung cancer Mother        smoker  . COPD Father   . Ovarian cancer Sister        was not offered surgery. Was in her late 32's   Social  Hx:  . Tobacco use: Previous 15 year smoker, quit 1970's . Alcohol use: none . Illicit Drug use: none . Illicit IV Drug use: none    Review of Systems: Review of Systems  Genitourinary: Positive for pelvic pain.   All other systems reviewed and are negative.  Vitals: Blood pressure (!) 148/64, pulse 68, temperature 97.6 F (36.4 C), temperature source Oral, resp. rate 20, height 5\' 2"  (1.575 m), weight 155 lb (70.3 kg), SpO2 96 %. Vitals:   11/22/17 1117  Weight: 155 lb (70.3 kg)  Height: 5\' 2"  (1.575 m)    Body mass index is 28.35 kg/m.  Physical Exam: General :  Well developed,  84 y.o., female in no apparent distress HEENT:  Normocephalic/atraumatic, symmetric, EOMI, eyelids normal Neck:   Supple, no masses.  Lymphatics:  No cervical/ submandibular/ supraclavicular/ infraclavicular/ inguinal adenopathy Respiratory:  Respirations unlabored, no use of accessory muscles CV:   Deferred Breast:  Deferred Musculoskeletal: No CVA tenderness, normal muscle strength. Abdomen:  Soft, non-tender and nondistended. No evidence of hernia. No masses. Extremities:  No lymphedema, no erythema, non-tender. Skin:   Normal inspection Neuro/Psych:  No focal motor deficit, no abnormal mental status. Normal gait. Normal affect. Alert and oriented to person, place, and time  Genito Urinary: Vulva: Normal external female genitalia.  Bladder/urethra: Urethral meatus normal in size and location. No lesions or   masses, well supported bladder Speculum exam: Vagina: No lesion, no discharge, no bleeding. Bimanual exam: Cervix/Uterus/Adnexa: Surgically absent  Adnexa: +palpable fullness on bimanual. Difficult to elevate out of pelvis. No nodularity Rectovaginal:  Good tone, no cul de sac nodularity, no parametrial involvement or nodularity. + mass on exam that feels mobile   Assessment  Pelvic mass with normal CA125/ h/o Hysterectomy/Appy  Plan   1. Pelvic mass ? Imaging was reviewed today by me personally. The patient and I looked specifically at the ultrasound images together and I pointed out the "daughter cysts" that make this mass fall into the "complex" category. ? Despite the reassuring tumor markers, given the complexity, I recommend surgical resection ? We did discuss the CA125 and HE4 results and the calculated postmenopausal level. 2. Surgical discussion ? We agreed on a plan for laparoscopic possibly robotic BSO she does understand there is a risk regardless of the intent for minimally invasive procedure that laparotomy may be the final modality ? We discussed if malignancy  is suspected at the time of surgery that staging would be performed.  She understands this may be via laparotomy.  ? Surgical sketch was reviewed including the risk, benefits, alternatives of the procedure and the patient was given a copy. ? I did explain the increased risk of "scar tissue" given her prior hysterectomy and surrounding organ injury. ? By the end of the visit her questions were answered to satisfaction. 3. Return to clinic 10 to 14 days postop to review pathology and evaluate incisions 4. She has been on Estradiol >10 years. We did review this is outside the standard recommendations for HRT  Isabel Caprice, MD  11/29/2017, 5:45 PM    Cc: Janyth Pupa, DO (Referring Ob/Gyn) Shirline Frees, MD (PCP)

## 2017-11-29 NOTE — Progress Notes (Signed)
[place] at [facility]   Consult Note: New Patient FIRST VISIT   Consult was requested by Dr. Janyth Pupa   Chief Complaint  Patient presents with  . Right ovarian cyst    HPI: Ms. Sandra Mullins  is a very nice 84 y.o.  P2  She noted left flank pain radiating to her hip and about 4 days later this returned. She therefore followed up with her PCP. Imaging was obtained and she was found to have kidney stones. She saw urology who treated the stones and placed a left ureteral stent. The stent is due out this week that she is meeting me for the first time.  On imaging there was also noted to be an adnexal mass 8.5cm in the right. Follow-up ultrasound 10/25/17 revealed (absent uterus) a complex right adenxal "mass" 7x7x??0.5cm with multiple septations and mobile d3ebris within the cystic mass. Some vascularity along one portion of the mass.  She was seen by Dr.Ozan with Gyn. A ROMA was obtained with normal CA125 (7.1 on 7/25) and elevated HE4, but calculated normal postmenopausal ROMA.  Last colonoscopy was 2015 with next due 2020.  She notes some RLQ pain, described as dull. No N/V. Some loss of body weight about 10lb in 6 months but has had multiple "medical" issues including the flu that she attributes this to. Has a good appetite. Normal bladder/bowel function. No early satiety.  RE: PCN allergy. States occurred in her late 26's and felt ill with chest pressure. Unsure if ever has had Keflex.  Imported EPIC Oncologic History:   No history exists.    Measurement of disease:  CA125  . 11/18/17 = 7.1 (preop)  Radiology:  10/22/17 - CTAP - Lower chest: No acute abnormality. Aortic Atherosclerosis. Hepatobiliary: No focal liver abnormality is seen. No gallstones, gallbladder wall thickening, or biliary dilatation. Pancreas: Unremarkable. No pancreatic ductal dilatation or surrounding inflammatory changes. Spleen: Normal in size without focal  abnormality. Adrenals/Urinary Tract: Normal adrenal glands. 1 mm calcification in the lower pole rightrenal collecting system. 2 mm calcification in the lower pole left renal collecting system. Moderate left hydronephrosis and ureterectasis. Mild inflammatory/edematous changes around the left kidney and renal collecting system. There is a nonobstructing 4 mm UPJ calculus, and continuation of ureteral ureterectasis down to a 10 mm calculus at the level of the SI joint. Urinary bladder is nondistended. Stomach/Bowel: Stomach and small bowel are decompressed. Appendix surgically absent. The colon is nondilated. No focal wall thickening or adjacent inflammatory/edematous change. Vascular/Lymphatic: Heavy calcified aortoiliac atheromatous plaque without aneurysm. Bilateral pelvic phleboliths. No abdominal or pelvic adenopathy. Reproductive: Previous hysterectomy. 8.5 cm cystic right adnexal process with mural nodularity, previously 4.9 cm (compared to 05/2013 CT). Other: No ascites.  No free air. Musculoskeletal: Midline anterior abdominal wall sutures below the umbilicus. Left hip DJD. Degenerative disc disease L5-S1. Negative for fracture or worrisome bone lesion. IMPRESSION: 1. Obstructing 10 mm distal left ureteral calculus, and a second 4 mm calculus near the UPJ. 2. Bilateral nephrolithiasis 3. Enlarging 8.5 cm cystic right adnexal process. Recommend outpatient elective pelvic ultrasound for further characterization to exclude neoplasm.  10/25/17 - TVUS -  Uterus The uterus and endometrium are surgically absent. Right ovary The right ovary could not be visualized. Left ovary The left ovary could not be visualized. Other findings there is a complex appearing right adnexal mass measuring 7.0 x 7.0 by a 0.5 cm there are multiple septations with some daughter cyst type appearance. There is  some mobile debris within the cystic mass. There is some vascularity along one portion of the mass.  IMPRESSION: Abnormal complex  appearing cystic right adnexal mass worrisome for neoplasm either benign or malignant. Gynecological evaluation is recommended. The uterus is surgically absent. The ovaries could not be discretely identified.  Outpatient Encounter Medications as of 11/22/2017  Medication Sig  . estradiol (ESTRACE) 0.5 MG tablet Take 0.5 mg by mouth every morning.  . hydrochlorothiazide (MICROZIDE) 12.5 MG capsule Take 12.5 mg by mouth every morning.   Marland Kitchen losartan (COZAAR) 100 MG tablet Take 100 mg by mouth every morning.   . metroNIDAZOLE (METROGEL) 0.75 % gel Apply 1 application topically as needed.  . Multiple Vitamins-Minerals (WOMENS MULTI) CAPS Take 1 capsule by mouth daily.   . fluticasone (FLONASE) 50 MCG/ACT nasal spray Place into both nostrils as needed for allergies or rhinitis.  . [DISCONTINUED] ketorolac (TORADOL) 10 MG tablet Take 1 tablet (10 mg total) by mouth every 6 (six) hours as needed for moderate pain. Or stent discomfort post-operatively. Pt has tolerated prior. (Patient not taking: Reported on 11/22/2017)  . [DISCONTINUED] senna-docusate (SENOKOT-S) 8.6-50 MG tablet Take 1 tablet by mouth 2 (two) times daily. While taking strong pain meds to prevent constipation (Patient not taking: Reported on 11/22/2017)  . [DISCONTINUED] sulfamethoxazole-trimethoprim (BACTRIM DS,SEPTRA DS) 800-160 MG tablet Take 1 tablet by mouth 2 (two) times daily. X 3 days. Begin day before next Urology appointment. (Patient not taking: Reported on 11/22/2017)  . [DISCONTINUED] traMADol (ULTRAM) 50 MG tablet Take 1-2 tablets (50-100 mg total) by mouth every 6 (six) hours as needed for severe pain. Post-operatively (Patient not taking: Reported on 11/22/2017)   No facility-administered encounter medications on file as of 11/22/2017.    Allergies  Allergen Reactions  . Penicillins Other (See Comments)    Pt felt like she was going to die     Past Medical History:  Diagnosis Date  . History of lower GI bleeding 05/2013    due to colitis  . Hypertension   . Left ureteral stone   . Nephrolithiasis    bilateral nonobstructive per CT 10-22-2017   Past Surgical History:  Procedure Laterality Date  . ABDOMINAL HYSTERECTOMY  1970s   due to menorrhagia ; per pt retained ovaries  . APPENDECTOMY  age 83   Open appy. States no rupture  . CATARACT EXTRACTION     Unsure of the date  . CYSTOSCOPY WITH RETROGRADE PYELOGRAM, URETEROSCOPY AND STENT PLACEMENT Left 11/12/2017   Procedure: CYSTOSCOPY WITH RETROGRADE PYELOGRAM, URETEROSCOPY AND STENT PLACEMENT;  Surgeon: Alexis Frock, MD;  Location: [facility];  Service: Urology;  Laterality: Left;  . DILATION AND CURETTAGE OF UTERUS  x2  yrs ago  . HOLMIUM LASER APPLICATION Left 4/91/7915   Procedure: HOLMIUM LASER APPLICATION;  Surgeon: Alexis Frock, MD;  Location: [facility];  Service: Urology;  Laterality: Left;        Past Gynecological History:   GYNECOLOGIC HISTORY:  No LMP recorded. Patient has had a hysterectomy. . Menarche: 84 years old . P 2 . LMP Unsure . Contraceptive Prior OCP . HRT YES CURRENT  . Last Pap N/A - Hyst 1970's Family Hx:  Family History  Problem Relation Age of Onset  . Lung cancer Mother        smoker  . COPD Father   . Ovarian cancer Sister        was not offered surgery. Was in her late 44's   Social  Hx:  . Tobacco use: Previous 15 year smoker, quit 1970's . Alcohol use: none . Illicit Drug use: none . Illicit IV Drug use: none    Review of Systems: Review of Systems  Genitourinary: Positive for pelvic pain.   All other systems reviewed and are negative.  Vitals: Blood pressure (!) 148/64, pulse 68, temperature 97.6 F (36.4 C), temperature source Oral, resp. rate 20, height 5\' 2"  (1.575 m), weight 155 lb (70.3 kg), SpO2 96 %. Vitals:   11/22/17 1117  Weight: 155 lb (70.3 kg)  Height: 5\' 2"  (1.575 m)    Body mass index is 28.35 kg/m.  Physical Exam: General :  Well developed,  84 y.o., female in no apparent distress HEENT:  Normocephalic/atraumatic, symmetric, EOMI, eyelids normal Neck:   Supple, no masses.  Lymphatics:  No cervical/ submandibular/ supraclavicular/ infraclavicular/ inguinal adenopathy Respiratory:  Respirations unlabored, no use of accessory muscles CV:   Deferred Breast:  Deferred Musculoskeletal: No CVA tenderness, normal muscle strength. Abdomen:  Soft, non-tender and nondistended. No evidence of hernia. No masses. Extremities:  No lymphedema, no erythema, non-tender. Skin:   Normal inspection Neuro/Psych:  No focal motor deficit, no abnormal mental status. Normal gait. Normal affect. Alert and oriented to person, place, and time  Genito Urinary: Vulva: Normal external female genitalia.  Bladder/urethra: Urethral meatus normal in size and location. No lesions or   masses, well supported bladder Speculum exam: Vagina: No lesion, no discharge, no bleeding. Bimanual exam: Cervix/Uterus/Adnexa: Surgically absent  Adnexa: +palpable fullness on bimanual. Difficult to elevate out of pelvis. No nodularity Rectovaginal:  Good tone, no cul de sac nodularity, no parametrial involvement or nodularity. + mass on exam that feels mobile   Assessment  Pelvic mass with normal CA125/ h/o Hysterectomy/Appy  Plan   1. Pelvic mass ? Imaging was reviewed today by me personally. The patient and I looked specifically at the ultrasound images together and I pointed out the "daughter cysts" that make this mass fall into the "complex" category. ? Despite the reassuring tumor markers, given the complexity, I recommend surgical resection ? We did discuss the CA125 and HE4 results and the calculated postmenopausal level. 2. Surgical discussion ? We agreed on a plan for laparoscopic possibly robotic BSO she does understand there is a risk regardless of the intent for minimally invasive procedure that laparotomy may be the final modality ? We discussed if malignancy  is suspected at the time of surgery that staging would be performed.  She understands this may be via laparotomy.  ? Surgical sketch was reviewed including the risk, benefits, alternatives of the procedure and the patient was given a copy. ? I did explain the increased risk of "scar tissue" given her prior hysterectomy and surrounding organ injury. ? By the end of the visit her questions were answered to satisfaction. 3. Return to clinic 10 to 14 days postop to review pathology and evaluate incisions 4. She has been on Estradiol >10 years. We did review this is outside the standard recommendations for HRT  Isabel Caprice, MD  11/29/2017, 5:45 PM    Cc: Janyth Pupa, DO (Referring Ob/Gyn) Shirline Frees, MD (PCP)

## 2017-12-03 DIAGNOSIS — H02831 Dermatochalasis of right upper eyelid: Secondary | ICD-10-CM | POA: Diagnosis not present

## 2017-12-03 DIAGNOSIS — H26492 Other secondary cataract, left eye: Secondary | ICD-10-CM | POA: Diagnosis not present

## 2017-12-03 DIAGNOSIS — H26493 Other secondary cataract, bilateral: Secondary | ICD-10-CM | POA: Diagnosis not present

## 2017-12-03 DIAGNOSIS — H18413 Arcus senilis, bilateral: Secondary | ICD-10-CM | POA: Diagnosis not present

## 2017-12-03 DIAGNOSIS — Z961 Presence of intraocular lens: Secondary | ICD-10-CM | POA: Diagnosis not present

## 2017-12-07 NOTE — Progress Notes (Signed)
ekg 11-02-17 epic

## 2017-12-07 NOTE — Patient Instructions (Addendum)
Sandra Mullins  12/07/2017   Your procedure is scheduled on: 12-14-17 Tuesday  Report to [facility] Main  Entrance   Report to admitting at 530 AM    Call this number if you have problems the morning of surgery [phone]    Remember: Do not eat food or drink liquids :After Midnight.   Eat a light diet the day before surgery on 12-13-17.  Examples including soups, broths, toast, yogurt, mashed potatoes.  Things to avoid include carbonated beverages (fizzy beverages), raw fruits and raw vegetables, or beans.   If your bowels are filled with gas, your surgeon will have difficulty visualizing your pelvic organs which increases your surgical risks. Remember: Do not eat food :After Midnight.   NO SOLID FOOD AFTER MIDNIGHT THE NIGHT PRIOR TO SURGERY. NOTHING BY MOUTH EXCEPT CLEAR LIQUIDS UNTIL 3 HOURS PRIOR TO [place] SURGERY. PLEASE FINISH ENSURE DRINK PER SURGEON ORDER 3 HOURS PRIOR TO SCHEDULED SURGERY TIME WHICH NEEDS TO BE COMPLETED AT 430 AM.      CLEAR LIQUID DIET   Foods Allowed                                                                     Foods Excluded  Coffee and tea, regular and decaf                             liquids that you cannot  Plain Jell-O in any flavor                                             see through such as: Fruit ices (not with fruit pulp)                                     milk, soups, orange juice  Iced Popsicles Sports drinks like gatorade                                                    All solid food Lightly seasoned clear broth or consume(fat free) Sugar, honey syrup  Sample Menu Breakfast                                Lunch                                     Supper Cranberry juice                    Beef broth  Chicken broth Jell-O                                     Grape juice                           Apple juice Coffee or tea                        Jell-O                                       Popsicle                                                Coffee or tea                        Coffee or tea   Take these medicines the morning of surgery with A SIP OF WATER: none              You may not have any metal on your body including hair pins and              piercings  Do not wear jewelry, make-up, lotions, powders or perfumes, deodorant             Do not wear nail polish.  Do not shave  48 hours prior to surgery.                Do not bring valuables to the hospital. [place].  Contacts, dentures or bridgework may not be worn into surgery.  Leave suitcase in the car. After surgery it may be brought to your room.                   Please read over the following fact sheets you were given: _____________________________________________________________________  [facility] - Preparing for Surgery Before surgery, you can play an important role.  Because skin is not sterile, your skin needs to be as free of germs as possible.  You can reduce the number of germs on your skin by washing with CHG (chlorahexidine gluconate) soap before surgery.  CHG is an antiseptic cleaner which kills germs and bonds with the skin to continue killing germs even after washing. Please DO NOT use if you have an allergy to CHG or antibacterial soaps.  If your skin becomes reddened/irritated stop using the CHG and inform your nurse when you arrive at Short Stay. Do not shave (including legs and underarms) for at least 48 hours prior to the first CHG shower.  You may shave your face/neck. Please follow these instructions carefully:  1.  Shower with CHG Soap the night before surgery and the  morning of Surgery.  2.  If you choose to wash your hair, wash your hair first as usual with your  normal  shampoo.  3.  After you shampoo, rinse your hair and body thoroughly to remove the  shampoo.  4.  Use CHG as you would any other  liquid soap.  You can apply chg directly  to the skin and wash                       Gently with a scrungie or clean washcloth.  5.  Apply the CHG Soap to your body ONLY FROM THE NECK DOWN.   Do not use on face/ open                           Wound or open sores. Avoid contact with eyes, ears mouth and genitals (private parts).                       Wash face,  Genitals (private parts) with your normal soap.             6.  Wash thoroughly, paying special attention to the area where your surgery  will be performed.  7.  Thoroughly rinse your body with warm water from the neck down.  8.  DO NOT shower/wash with your normal soap after using and rinsing off  the CHG Soap.                9.  Pat yourself dry with a clean towel.            10.  Wear clean pajamas.            11.  Place clean sheets on your bed the night of your first shower and do not  sleep with pets. Day of Surgery : Do not apply any lotions/deodorants the morning of surgery.  Please wear clean clothes to the hospital/surgery center.  FAILURE TO FOLLOW THESE INSTRUCTIONS MAY RESULT IN THE CANCELLATION OF YOUR SURGERY PATIENT SIGNATURE_________________________________  NURSE SIGNATURE__________________________________  ________________________________________________________________________   Sandra Mullins  An incentive spirometer is a tool that can help keep your lungs clear and active. This tool measures how well you are filling your lungs with each breath. Taking long deep breaths may help reverse or decrease the chance of developing breathing (pulmonary) problems (especially infection) following:  A long period of time when you are unable to move or be active. BEFORE THE PROCEDURE   If the spirometer includes an indicator to show your best effort, your nurse or respiratory therapist will set it to a desired goal.  If possible, sit up straight or lean slightly forward. Try not to slouch.  Hold the incentive  spirometer in an upright position. INSTRUCTIONS FOR USE  1. Sit on the edge of your bed if possible, or sit up as far as you can in bed or on a chair. 2. Hold the incentive spirometer in an upright position. 3. Breathe out normally. 4. Place the mouthpiece in your mouth and seal your lips tightly around it. 5. Breathe in slowly and as deeply as possible, raising the piston or the ball toward the top of the column. 6. Hold your breath for 3-5 seconds or for as long as possible. Allow the piston or ball to fall to the bottom of the column. 7. Remove the mouthpiece from your mouth and breathe out normally. 8. Rest for a few seconds and repeat Steps 1 through 7 at least 10 times every 1-2 hours when you are awake. Take your time and take a few normal breaths between deep breaths. 9. The spirometer may include an indicator to  show your best effort. Use the indicator as a goal to work toward during each repetition. 10. After each set of 10 deep breaths, practice coughing to be sure your lungs are clear. If you have an incision (the cut made at the time of surgery), support your incision when coughing by placing a pillow or rolled up towels firmly against it. Once you are able to get out of bed, walk around indoors and cough well. You may stop using the incentive spirometer when instructed by your caregiver.  RISKS AND COMPLICATIONS  Take your time so you do not get dizzy or light-headed.  If you are in pain, you may need to take or ask for pain medication before doing incentive spirometry. It is harder to take a deep breath if you are having pain. AFTER USE  Rest and breathe slowly and easily.  It can be helpful to keep track of a log of your progress. Your caregiver can provide you with a simple table to help with this. If you are using the spirometer at home, follow these instructions: [place] IF:   You are having difficultly using the spirometer.  You have trouble using the  spirometer as often as instructed.  Your pain medication is not giving enough relief while using the spirometer.  You develop fever of 100.5 F (38.1 C) or higher. SEEK IMMEDIATE MEDICAL CARE IF:   You cough up bloody sputum that had not been present before.  You develop fever of 102 F (38.9 C) or greater.  You develop worsening pain at or near the incision site. MAKE SURE YOU:   Understand these instructions.  Will watch your condition.  Will get help right away if you are not doing well or get worse. Document Released: 08/24/2006 Document Revised: 07/06/2011 Document Reviewed: 10/25/2006 Exit[employer] Patient Information 2014 [employer], [place].   ________________________________________________________________________  WHAT IS A BLOOD TRANSFUSION? Blood Transfusion Information  A transfusion is the replacement of blood or some of its parts. Blood is made up of multiple cells which provide different functions.  Red blood cells carry oxygen and are used for blood loss replacement.  White blood cells fight against infection.  Platelets control bleeding.  Plasma helps clot blood.  Other blood products are available for specialized needs, such as hemophilia or other clotting disorders. BEFORE THE TRANSFUSION  Who gives blood for transfusions?   Healthy volunteers who are fully evaluated to make sure their blood is safe. This is blood bank blood. Transfusion therapy is the safest it has ever been in the practice of medicine. Before blood is taken from a donor, a complete history is taken to make sure that person has no history of diseases nor engages in risky social behavior (examples are intravenous drug use or sexual activity with multiple partners). The donor's travel history is screened to minimize risk of transmitting infections, such as malaria. The donated blood is tested for signs of infectious diseases, such as HIV and hepatitis. The blood is then tested to be sure it is  compatible with you in order to minimize the chance of a transfusion reaction. If you or a relative donates blood, this is often done in anticipation of surgery and is not appropriate for emergency situations. It takes many days to process the donated blood. RISKS AND COMPLICATIONS Although transfusion therapy is very safe and saves many lives, the main dangers of transfusion include:   Getting an infectious disease.  Developing a transfusion reaction. This is an allergic reaction  to something in the blood you were given. Every precaution is taken to prevent this. The decision to have a blood transfusion has been considered carefully by your caregiver before blood is given. Blood is not given unless the benefits outweigh the risks. AFTER THE TRANSFUSION  Right after receiving a blood transfusion, you will usually feel much better and more energetic. This is especially true if your red blood cells have gotten low (anemic). The transfusion raises the level of the red blood cells which carry oxygen, and this usually causes an energy increase.  The nurse administering the transfusion will monitor you carefully for complications. HOME CARE INSTRUCTIONS  No special instructions are needed after a transfusion. You may find your energy is better. Speak with your caregiver about any limitations on activity for underlying diseases you may have. SEEK MEDICAL CARE IF:   Your condition is not improving after your transfusion.  You develop redness or irritation at the intravenous (IV) site. SEEK IMMEDIATE MEDICAL CARE IF:  Any of the following symptoms occur over the next 12 hours:  Shaking chills.  You have a temperature by mouth above 102 F (38.9 C), not controlled by medicine.  Chest, back, or muscle pain.  People around you feel you are not acting correctly or are confused.  Shortness of breath or difficulty breathing.  Dizziness and fainting.  You get a rash or develop hives.  You have  a decrease in urine output.  Your urine turns a dark color or changes to pink, red, or brown. Any of the following symptoms occur over the next 10 days:  You have a temperature by mouth above 102 F (38.9 C), not controlled by medicine.  Shortness of breath.  Weakness after normal activity.  The white part of the eye turns yellow (jaundice).  You have a decrease in the amount of urine or are urinating less often.  Your urine turns a dark color or changes to pink, red, or brown. Document Released: 04/10/2000 Document Revised: 07/06/2011 Document Reviewed: 11/28/2007 [facility] Patient Information 2014 [place], [place].  _______________________________________________________________________

## 2017-12-08 ENCOUNTER — Other Ambulatory Visit: Payer: Self-pay

## 2017-12-08 ENCOUNTER — Encounter (HOSPITAL_COMMUNITY)
Admission: RE | Admit: 2017-12-08 | Discharge: 2017-12-08 | Disposition: A | Discharge status: Home / Self Care | Payer: PPO | Source: Ambulatory Visit | Attending: Obstetrics | Admitting: Obstetrics

## 2017-12-08 ENCOUNTER — Ambulatory Visit: Payer: PPO | Admitting: Obstetrics

## 2017-12-08 ENCOUNTER — Encounter (HOSPITAL_COMMUNITY): Payer: Self-pay

## 2017-12-08 DIAGNOSIS — R1900 Intra-abdominal and pelvic swelling, mass and lump, unspecified site: Secondary | ICD-10-CM | POA: Insufficient documentation

## 2017-12-08 DIAGNOSIS — Z01812 Encounter for preprocedural laboratory examination: Secondary | ICD-10-CM | POA: Insufficient documentation

## 2017-12-08 HISTORY — DX: Personal history of urinary calculi: Z87.442

## 2017-12-08 LAB — COMPREHENSIVE METABOLIC PANEL
ALBUMIN: 4 g/dL (ref 3.5–5.0)
ALK PHOS: 46 U/L (ref 38–126)
ALT: 14 U/L (ref 0–44)
ANION GAP: 10 (ref 5–15)
AST: 25 U/L (ref 15–41)
BILIRUBIN TOTAL: 0.8 mg/dL (ref 0.3–1.2)
BUN: 21 mg/dL (ref 8–23)
CO2: 29 mmol/L (ref 22–32)
CREATININE: 1.28 mg/dL — AB (ref 0.44–1.00)
Calcium: 10.1 mg/dL (ref 8.9–10.3)
Chloride: 102 mmol/L (ref 98–111)
GFR calc Af Amer: 44 mL/min — ABNORMAL LOW (ref >60)
GFR calc non Af Amer: 38 mL/min — ABNORMAL LOW (ref >60)
GLUCOSE: 90 mg/dL (ref 70–99)
Potassium: 3.7 mmol/L (ref 3.5–5.1)
SODIUM: 141 mmol/L (ref 135–145)
TOTAL PROTEIN: 7.3 g/dL (ref 6.5–8.1)

## 2017-12-08 LAB — ABO/RH: ABO/RH(D): A POS

## 2017-12-08 LAB — URINALYSIS, ROUTINE W REFLEX MICROSCOPIC
Bilirubin Urine: NEGATIVE
GLUCOSE, UA: NEGATIVE mg/dL
Hgb urine dipstick: NEGATIVE
KETONES UR: NEGATIVE mg/dL
Leukocytes, UA: NEGATIVE
NITRITE: NEGATIVE
Protein, ur: NEGATIVE mg/dL
SPECIFIC GRAVITY, URINE: 1.009 (ref 1.005–1.030)
pH: 5 (ref 5.0–8.0)

## 2017-12-08 LAB — CBC
HEMATOCRIT: 37.2 % (ref 36.0–46.0)
HEMOGLOBIN: 12.1 g/dL (ref 12.0–15.0)
MCH: 29.9 pg (ref 26.0–34.0)
MCHC: 32.5 g/dL (ref 30.0–36.0)
MCV: 91.9 fL (ref 78.0–100.0)
Platelets: 221 10*3/uL (ref 150–400)
RBC: 4.05 MIL/uL (ref 3.87–5.11)
RDW: 12.6 % (ref 11.5–15.5)
WBC: 9.2 10*3/uL (ref 4.0–10.5)

## 2017-12-10 DIAGNOSIS — Z961 Presence of intraocular lens: Secondary | ICD-10-CM | POA: Diagnosis not present

## 2017-12-10 DIAGNOSIS — Z9842 Cataract extraction status, left eye: Secondary | ICD-10-CM | POA: Diagnosis not present

## 2017-12-10 DIAGNOSIS — H5213 Myopia, bilateral: Secondary | ICD-10-CM | POA: Diagnosis not present

## 2017-12-10 DIAGNOSIS — H52223 Regular astigmatism, bilateral: Secondary | ICD-10-CM | POA: Diagnosis not present

## 2017-12-13 NOTE — Anesthesia Preprocedure Evaluation (Addendum)
Anesthesia Evaluation  Patient identified by MRN, date of birth, ID band Patient awake    Reviewed: Allergy & Precautions, NPO status , Patient's Chart, lab work & pertinent test results  Airway Mallampati: II  TM Distance: >3 FB Neck ROM: Full    Dental  (+) Teeth Intact, Dental Advisory Given   Pulmonary neg pulmonary ROS, former smoker,    Pulmonary exam normal breath sounds clear to auscultation       Cardiovascular hypertension, Pt. on medications negative cardio ROS Normal cardiovascular exam Rhythm:Regular Rate:Normal     Neuro/Psych negative neurological ROS  negative psych ROS   GI/Hepatic negative GI ROS, Neg liver ROS,   Endo/Other  negative endocrine ROS  Renal/GU Renal disease     Musculoskeletal negative musculoskeletal ROS (+)   Abdominal   Peds  Hematology negative hematology ROS (+)   Anesthesia Other Findings Pelvic mass Robot assisted BSO staging  Reproductive/Obstetrics negative OB ROS                            Lab Results  Component Value Date   CREATININE 1.28 (H) 12/08/2017   BUN 21 12/08/2017   NA 141 12/08/2017   K 3.7 12/08/2017   CL 102 12/08/2017   CO2 29 12/08/2017    Lab Results  Component Value Date   WBC 9.2 12/08/2017   HGB 12.1 12/08/2017   HCT 37.2 12/08/2017   MCV 91.9 12/08/2017   PLT 221 12/08/2017    Anesthesia Physical Anesthesia Plan  ASA: III  Anesthesia Plan: General   Post-op Pain Management:    Induction: Intravenous  PONV Risk Score and Plan: 3 and Treatment may vary due to age or medical condition, Dexamethasone and Ondansetron  Airway Management Planned: Oral ETT  Additional Equipment:   Intra-op Plan:   Post-operative Plan: Extubation in OR  Informed Consent: I have reviewed the patients History and Physical, chart, labs and discussed the procedure including the risks, benefits and alternatives for the  proposed anesthesia with the patient or authorized representative who has indicated his/her understanding and acceptance.   Dental advisory given  Plan Discussed with: CRNA  Anesthesia Plan Comments:         Anesthesia Quick Evaluation

## 2017-12-14 ENCOUNTER — Ambulatory Visit (HOSPITAL_COMMUNITY): Payer: PPO | Admitting: Anesthesiology

## 2017-12-14 ENCOUNTER — Encounter (HOSPITAL_COMMUNITY): Payer: Self-pay

## 2017-12-14 ENCOUNTER — Encounter (HOSPITAL_COMMUNITY)
Admission: RE | Disposition: A | Discharge status: Home / Self Care | Payer: Self-pay | Source: Ambulatory Visit | Attending: Obstetrics

## 2017-12-14 ENCOUNTER — Ambulatory Visit (HOSPITAL_COMMUNITY)
Admission: RE | Admit: 2017-12-14 | Discharge: 2017-12-14 | Disposition: A | Discharge status: Home / Self Care | Payer: PPO | Source: Ambulatory Visit | Attending: Obstetrics | Admitting: Obstetrics

## 2017-12-14 DIAGNOSIS — R8569 Abnormal cytological findings in specimens from other digestive organs and abdominal cavity: Secondary | ICD-10-CM | POA: Diagnosis not present

## 2017-12-14 DIAGNOSIS — R1900 Intra-abdominal and pelvic swelling, mass and lump, unspecified site: Secondary | ICD-10-CM | POA: Diagnosis present

## 2017-12-14 DIAGNOSIS — Z87891 Personal history of nicotine dependence: Secondary | ICD-10-CM | POA: Diagnosis not present

## 2017-12-14 DIAGNOSIS — Z801 Family history of malignant neoplasm of trachea, bronchus and lung: Secondary | ICD-10-CM | POA: Diagnosis not present

## 2017-12-14 DIAGNOSIS — N289 Disorder of kidney and ureter, unspecified: Secondary | ICD-10-CM | POA: Diagnosis not present

## 2017-12-14 DIAGNOSIS — Z8041 Family history of malignant neoplasm of ovary: Secondary | ICD-10-CM | POA: Diagnosis not present

## 2017-12-14 DIAGNOSIS — N83201 Unspecified ovarian cyst, right side: Secondary | ICD-10-CM

## 2017-12-14 DIAGNOSIS — Z9849 Cataract extraction status, unspecified eye: Secondary | ICD-10-CM | POA: Insufficient documentation

## 2017-12-14 DIAGNOSIS — K660 Peritoneal adhesions (postprocedural) (postinfection): Secondary | ICD-10-CM | POA: Diagnosis not present

## 2017-12-14 DIAGNOSIS — D270 Benign neoplasm of right ovary: Secondary | ICD-10-CM | POA: Diagnosis not present

## 2017-12-14 DIAGNOSIS — Z90710 Acquired absence of both cervix and uterus: Secondary | ICD-10-CM | POA: Diagnosis not present

## 2017-12-14 DIAGNOSIS — N736 Female pelvic peritoneal adhesions (postinfective): Secondary | ICD-10-CM | POA: Insufficient documentation

## 2017-12-14 DIAGNOSIS — Z79899 Other long term (current) drug therapy: Secondary | ICD-10-CM | POA: Insufficient documentation

## 2017-12-14 DIAGNOSIS — I10 Essential (primary) hypertension: Secondary | ICD-10-CM | POA: Insufficient documentation

## 2017-12-14 DIAGNOSIS — Z825 Family history of asthma and other chronic lower respiratory diseases: Secondary | ICD-10-CM | POA: Diagnosis not present

## 2017-12-14 DIAGNOSIS — Z87442 Personal history of urinary calculi: Secondary | ICD-10-CM | POA: Diagnosis not present

## 2017-12-14 HISTORY — PX: ROBOTIC ASSISTED BILATERAL SALPINGO OOPHERECTOMY: SHX6078

## 2017-12-14 LAB — TYPE AND SCREEN
ABO/RH(D): A POS
Antibody Screen: NEGATIVE

## 2017-12-14 SURGERY — SALPINGO-OOPHORECTOMY, BILATERAL, ROBOT-ASSISTED
Anesthesia: General | Laterality: Right

## 2017-12-14 MED ORDER — FENTANYL CITRATE (PF) 250 MCG/5ML IJ SOLN
INTRAMUSCULAR | Status: AC
  Filled 2017-12-14: qty 5

## 2017-12-14 MED ORDER — ALBUMIN HUMAN 5 % IV SOLN
12.5000 g | Freq: Once | INTRAVENOUS | Status: AC
  Administered 2017-12-14: 12.5 g via INTRAVENOUS

## 2017-12-14 MED ORDER — LACTATED RINGERS IV SOLN
INTRAVENOUS | Status: DC
  Administered 2017-12-14: 1000 mL via INTRAVENOUS
  Administered 2017-12-14: 07:00:00 via INTRAVENOUS
  Administered 2017-12-14: 1000 mL via INTRAVENOUS

## 2017-12-14 MED ORDER — ALBUMIN HUMAN 5 % IV SOLN
INTRAVENOUS | Status: AC
  Filled 2017-12-14: qty 250

## 2017-12-14 MED ORDER — LIDOCAINE-EPINEPHRINE (PF) 1 %-1:200000 IJ SOLN
INTRAMUSCULAR | Status: AC
  Filled 2017-12-14: qty 30

## 2017-12-14 MED ORDER — PROPOFOL 10 MG/ML IV BOLUS
INTRAVENOUS | Status: AC
  Filled 2017-12-14: qty 40

## 2017-12-14 MED ORDER — STERILE WATER FOR IRRIGATION IR SOLN
Status: DC | PRN
  Administered 2017-12-14: 1000 mL

## 2017-12-14 MED ORDER — FENTANYL CITRATE (PF) 100 MCG/2ML IJ SOLN
INTRAMUSCULAR | Status: AC
  Filled 2017-12-14: qty 2

## 2017-12-14 MED ORDER — DEXAMETHASONE SODIUM PHOSPHATE 4 MG/ML IJ SOLN
4.0000 mg | INTRAMUSCULAR | Status: AC
  Administered 2017-12-14: 4 mg via INTRAVENOUS

## 2017-12-14 MED ORDER — MEPERIDINE HCL 50 MG/ML IJ SOLN: 6.2500 mg | INTRAMUSCULAR | Status: DC | PRN

## 2017-12-14 MED ORDER — PROPOFOL 10 MG/ML IV BOLUS
INTRAVENOUS | Status: AC
  Filled 2017-12-14: qty 20

## 2017-12-14 MED ORDER — PHENYLEPHRINE 40 MCG/ML (10ML) SYRINGE FOR IV PUSH (FOR BLOOD PRESSURE SUPPORT)
PREFILLED_SYRINGE | INTRAVENOUS | Status: DC | PRN
  Administered 2017-12-14: 80 ug via INTRAVENOUS
  Administered 2017-12-14: 40 ug via INTRAVENOUS

## 2017-12-14 MED ORDER — FENTANYL CITRATE (PF) 100 MCG/2ML IJ SOLN
INTRAMUSCULAR | Status: DC | PRN
  Administered 2017-12-14 (×2): 50 ug via INTRAVENOUS

## 2017-12-14 MED ORDER — ACETAMINOPHEN 500 MG PO TABS
1000.0000 mg | ORAL_TABLET | ORAL | Status: AC
  Administered 2017-12-14: 1000 mg via ORAL
  Filled 2017-12-14: qty 2

## 2017-12-14 MED ORDER — ARTIFICIAL TEARS OPHTHALMIC OINT
TOPICAL_OINTMENT | OPHTHALMIC | Status: AC
  Filled 2017-12-14: qty 3.5

## 2017-12-14 MED ORDER — ONDANSETRON HCL 4 MG/2ML IJ SOLN
INTRAMUSCULAR | Status: DC | PRN
  Administered 2017-12-14: 4 mg via INTRAVENOUS

## 2017-12-14 MED ORDER — SODIUM CHLORIDE 0.9 % IR SOLN
Status: DC | PRN
  Administered 2017-12-14: 1000 mL

## 2017-12-14 MED ORDER — LIDOCAINE-EPINEPHRINE (PF) 1 %-1:200000 IJ SOLN
INTRAMUSCULAR | Status: DC | PRN
  Administered 2017-12-14: 30 mL

## 2017-12-14 MED ORDER — CLINDAMYCIN PHOSPHATE 900 MG/50ML IV SOLN
900.0000 mg | INTRAVENOUS | Status: AC
  Administered 2017-12-14: 900 mg via INTRAVENOUS
  Filled 2017-12-14: qty 50

## 2017-12-14 MED ORDER — HYDROCODONE-ACETAMINOPHEN 7.5-325 MG PO TABS: 1.0000 | ORAL_TABLET | Freq: Once | ORAL | Status: DC | PRN

## 2017-12-14 MED ORDER — SUGAMMADEX SODIUM 200 MG/2ML IV SOLN
INTRAVENOUS | Status: DC | PRN
  Administered 2017-12-14: 200 mg via INTRAVENOUS

## 2017-12-14 MED ORDER — ACETAMINOPHEN 10 MG/ML IV SOLN
INTRAVENOUS | Status: AC
  Filled 2017-12-14: qty 100

## 2017-12-14 MED ORDER — OXYCODONE-ACETAMINOPHEN 5-325 MG PO TABS: 1.0000 | ORAL_TABLET | Freq: Four times a day (QID) | ORAL | 0 refills | Status: DC | PRN

## 2017-12-14 MED ORDER — PHENYLEPHRINE 40 MCG/ML (10ML) SYRINGE FOR IV PUSH (FOR BLOOD PRESSURE SUPPORT)
PREFILLED_SYRINGE | INTRAVENOUS | Status: AC
  Filled 2017-12-14: qty 10

## 2017-12-14 MED ORDER — GABAPENTIN 300 MG PO CAPS
300.0000 mg | ORAL_CAPSULE | ORAL | Status: AC
  Administered 2017-12-14: 300 mg via ORAL
  Filled 2017-12-14: qty 1

## 2017-12-14 MED ORDER — PROPOFOL 10 MG/ML IV BOLUS
INTRAVENOUS | Status: DC | PRN
  Administered 2017-12-14: 120 mg via INTRAVENOUS

## 2017-12-14 MED ORDER — LIDOCAINE HCL (PF) 1 % IJ SOLN
INTRAMUSCULAR | Status: AC
  Filled 2017-12-14: qty 30

## 2017-12-14 MED ORDER — ROCURONIUM BROMIDE 10 MG/ML (PF) SYRINGE
PREFILLED_SYRINGE | INTRAVENOUS | Status: DC | PRN
  Administered 2017-12-14: 40 mg via INTRAVENOUS
  Administered 2017-12-14 (×2): 10 mg via INTRAVENOUS

## 2017-12-14 MED ORDER — LIDOCAINE 2% (20 MG/ML) 5 ML SYRINGE
INTRAMUSCULAR | Status: DC | PRN
  Administered 2017-12-14: 60 mg via INTRAVENOUS

## 2017-12-14 MED ORDER — SUGAMMADEX SODIUM 200 MG/2ML IV SOLN
INTRAVENOUS | Status: AC
  Filled 2017-12-14: qty 2

## 2017-12-14 MED ORDER — ACETAMINOPHEN 10 MG/ML IV SOLN
1000.0000 mg | Freq: Once | INTRAVENOUS | Status: DC | PRN
  Administered 2017-12-14: 1000 mg via INTRAVENOUS

## 2017-12-14 MED ORDER — ONDANSETRON HCL 4 MG/2ML IJ SOLN: 4.0000 mg | Freq: Once | INTRAMUSCULAR | Status: DC | PRN

## 2017-12-14 MED ORDER — FENTANYL CITRATE (PF) 100 MCG/2ML IJ SOLN
25.0000 ug | INTRAMUSCULAR | Status: DC | PRN
  Administered 2017-12-14: 50 ug via INTRAVENOUS

## 2017-12-14 MED ORDER — CIPROFLOXACIN IN D5W 400 MG/200ML IV SOLN
400.0000 mg | INTRAVENOUS | Status: AC
  Administered 2017-12-14: 400 mg via INTRAVENOUS
  Filled 2017-12-14: qty 200

## 2017-12-14 SURGICAL SUPPLY — 73 items
APPLICATOR COTTON TIP 6 STRL (MISCELLANEOUS) ×1 IMPLANT
APPLICATOR COTTON TIP 6IN STRL (MISCELLANEOUS) ×3
APPLICATOR SURGIFLO ENDO (HEMOSTASIS) IMPLANT
BAG LAPAROSCOPIC 12 15 PORT 16 (BASKET) ×1 IMPLANT
BAG RETRIEVAL 12/15 (BASKET) ×2
BAG RETRIEVAL 12/15MM (BASKET) ×1
BENZOIN TINCTURE PRP APPL 2/3 (GAUZE/BANDAGES/DRESSINGS) IMPLANT
CLOSURE WOUND 1/2 X4 (GAUZE/BANDAGES/DRESSINGS)
COVER BACK TABLE 60X90IN (DRAPES) IMPLANT
COVER SURGICAL LIGHT HANDLE (MISCELLANEOUS) ×3 IMPLANT
COVER TIP SHEARS 8 DVNC (MISCELLANEOUS) ×1 IMPLANT
COVER TIP SHEARS 8MM DA VINCI (MISCELLANEOUS) ×2
DERMABOND ADVANCED (GAUZE/BANDAGES/DRESSINGS) ×2
DERMABOND ADVANCED .7 DNX12 (GAUZE/BANDAGES/DRESSINGS) ×1 IMPLANT
DRAPE ARM DVNC X/XI (DISPOSABLE) ×4 IMPLANT
DRAPE COLUMN DVNC XI (DISPOSABLE) ×1 IMPLANT
DRAPE DA VINCI XI ARM (DISPOSABLE) ×8
DRAPE DA VINCI XI COLUMN (DISPOSABLE) ×2
DRAPE SHEET LG 3/4 BI-LAMINATE (DRAPES) ×3 IMPLANT
DRAPE SURG IRRIG POUCH 19X23 (DRAPES) ×3 IMPLANT
DRAPE UNDERBUTTOCKS STRL (DRAPE) ×3 IMPLANT
ELECT REM PT RETURN 15FT ADLT (MISCELLANEOUS) ×3 IMPLANT
GLOVE BIO SURGEON STRL SZ 6 (GLOVE) IMPLANT
GLOVE BIO SURGEON STRL SZ 6.5 (GLOVE) IMPLANT
GLOVE BIO SURGEONS STRL SZ 6.5 (GLOVE)
GLOVE BIOGEL PI IND STRL 7.0 (GLOVE) ×3 IMPLANT
GLOVE BIOGEL PI INDICATOR 7.0 (GLOVE) ×6
GLOVE SURG SS PI 6.5 STRL IVOR (GLOVE) ×9 IMPLANT
GOWN STRL REUS W/ TWL LRG LVL3 (GOWN DISPOSABLE) ×1 IMPLANT
GOWN STRL REUS W/TWL LRG LVL3 (GOWN DISPOSABLE) ×2
GOWN STRL REUS W/TWL XL LVL3 (GOWN DISPOSABLE) ×6 IMPLANT
GYRUS RUMI II 2.5CM BLUE (DISPOSABLE)
GYRUS RUMI II 3.5CM BLUE (DISPOSABLE)
HOLDER FOLEY CATH W/STRAP (MISCELLANEOUS) ×3 IMPLANT
IRRIG SUCT STRYKERFLOW 2 WTIP (MISCELLANEOUS) ×3
IRRIGATION SUCT STRKRFLW 2 WTP (MISCELLANEOUS) ×1 IMPLANT
KIT PROCEDURE DA VINCI SI (MISCELLANEOUS)
KIT PROCEDURE DVNC SI (MISCELLANEOUS) IMPLANT
MANIPULATOR UTERINE 4.5 ZUMI (MISCELLANEOUS) IMPLANT
NEEDLE SPNL 18GX3.5 QUINCKE PK (NEEDLE) IMPLANT
OBTURATOR OPTICAL STANDARD 8MM (TROCAR) ×2
OBTURATOR OPTICAL STND 8 DVNC (TROCAR) ×1
OBTURATOR OPTICALSTD 8 DVNC (TROCAR) ×1 IMPLANT
PACK ROBOT GYN CUSTOM WL (TRAY / TRAY PROCEDURE) ×3 IMPLANT
PAD POSITIONING PINK XL (MISCELLANEOUS) ×3 IMPLANT
PORT ACCESS TROCAR AIRSEAL 12 (TROCAR) ×1 IMPLANT
PORT ACCESS TROCAR AIRSEAL 5M (TROCAR) ×2
POUCH SPECIMEN RETRIEVAL 10MM (ENDOMECHANICALS) ×3 IMPLANT
RUMI II 3.0CM BLUE KOH-EFFICIE (DISPOSABLE) IMPLANT
RUMI II GYRUS 2.5CM BLUE (DISPOSABLE) IMPLANT
RUMI II GYRUS 3.5CM BLUE (DISPOSABLE) IMPLANT
SEAL CANN UNIV 5-8 DVNC XI (MISCELLANEOUS) ×4 IMPLANT
SEAL XI 5MM-8MM UNIVERSAL (MISCELLANEOUS) ×8
SET TRI-LUMEN FLTR TB AIRSEAL (TUBING) ×3 IMPLANT
STRIP CLOSURE SKIN 1/2X4 (GAUZE/BANDAGES/DRESSINGS) IMPLANT
SURGIFLO W/THROMBIN 8M KIT (HEMOSTASIS) IMPLANT
SUT VIC AB 0 CT1 27 (SUTURE)
SUT VIC AB 0 CT1 27XBRD ANTBC (SUTURE) IMPLANT
SUT VIC AB 4-0 P2 18 (SUTURE) ×6 IMPLANT
SUT VLOC 180 0 9IN  GS21 (SUTURE)
SUT VLOC 180 0 9IN GS21 (SUTURE) IMPLANT
SYR 10ML LL (SYRINGE) IMPLANT
TIP RUMI ORANGE 6.7MMX12CM (TIP) IMPLANT
TIP UTERINE 5.1X6CM LAV DISP (MISCELLANEOUS) IMPLANT
TIP UTERINE 6.7X10CM GRN DISP (MISCELLANEOUS) IMPLANT
TIP UTERINE 6.7X6CM WHT DISP (MISCELLANEOUS) IMPLANT
TIP UTERINE 6.7X8CM BLUE DISP (MISCELLANEOUS) IMPLANT
TOWEL OR NON WOVEN STRL DISP B (DISPOSABLE) ×3 IMPLANT
TRAP SPECIMEN MUCOUS 40CC (MISCELLANEOUS) ×3 IMPLANT
TRAY FOLEY CATH 14FRSI W/METER (CATHETERS) ×3 IMPLANT
TRAY FOLEY MTR SLVR 16FR STAT (SET/KITS/TRAYS/PACK) IMPLANT
UNDERPAD 30X30 (UNDERPADS AND DIAPERS) ×3 IMPLANT
WATER STERILE IRR 1000ML POUR (IV SOLUTION) IMPLANT

## 2017-12-14 NOTE — Transfer of Care (Signed)
Immediate Anesthesia Transfer of Care Note  Patient: CHASITTY HEHL  Procedure(s) Performed: XI ROBOTIC ASSISTED RIGHT SALPINGO OOPHORECTOMY WITH LYSIS OF ADHESIONS (Right )  Patient Location: PACU  Anesthesia Type:General  Level of Consciousness: sedated  Airway & Oxygen Therapy: Patient Spontanous Breathing and Patient connected to face mask oxygen  Post-op Assessment: Report given to RN and Post -op Vital signs reviewed and stable  Post vital signs: Reviewed and stable  Last Vitals:  Vitals Value Taken Time  BP 96/52 12/14/2017 10:12 AM  Temp    Pulse 69 12/14/2017 10:13 AM  Resp 17 12/14/2017 10:13 AM  SpO2 100 % 12/14/2017 10:13 AM  Vitals shown include unvalidated device data.  Last Pain:  Vitals:   12/14/17 0650  TempSrc:   PainSc: 0-No pain      Patients Stated Pain Goal: 4 (09/81/19 1478)  Complications: No apparent anesthesia complications

## 2017-12-14 NOTE — Anesthesia Postprocedure Evaluation (Signed)
Anesthesia Post Note  Patient: Sandra Mullins  Procedure(s) Performed: XI ROBOTIC ASSISTED RIGHT SALPINGO OOPHORECTOMY WITH LYSIS OF ADHESIONS (Right )     Patient location during evaluation: PACU Anesthesia Type: General Level of consciousness: awake and alert Pain management: pain level controlled Vital Signs Assessment: post-procedure vital signs reviewed and stable Respiratory status: spontaneous breathing, nonlabored ventilation, respiratory function stable and patient connected to nasal cannula oxygen Cardiovascular status: blood pressure returned to baseline and stable Postop Assessment: no apparent nausea or vomiting Anesthetic complications: no    Last Vitals:  Vitals:   12/14/17 1330 12/14/17 1345  BP:  (!) 108/58  Pulse:  67  Resp:  (!) 21  Temp: (!) 36.1 C (!) 36.1 C  SpO2:  98%    Last Pain:  Vitals:   12/14/17 1345  TempSrc:   PainSc: 0-No pain                 Barnet Glasgow

## 2017-12-14 NOTE — Anesthesia Procedure Notes (Signed)
Procedure Name: Intubation Date/Time: 12/14/2017 7:45 AM Performed by: Lieutenant Diego, CRNA Pre-anesthesia Checklist: Patient identified, Emergency Drugs available, Suction available and Patient being monitored Patient Re-evaluated:Patient Re-evaluated prior to induction Oxygen Delivery Method: Circle system utilized Preoxygenation: Pre-oxygenation with 100% oxygen Induction Type: IV induction Ventilation: Mask ventilation without difficulty Laryngoscope Size: Mac and 3 Grade View: Grade I Tube type: Oral Tube size: 7.0 mm Number of attempts: 1 Airway Equipment and Method: Stylet and Oral airway Placement Confirmation: ETT inserted through vocal cords under direct vision,  positive ETCO2 and breath sounds checked- equal and bilateral Secured at: 21 cm Tube secured with: Tape Dental Injury: Teeth and Oropharynx as per pre-operative assessment  Comments: Intubated by EMT student, Ovid Curd.

## 2017-12-14 NOTE — Discharge Instructions (Signed)
Salpingo-Oophorectomy, Care After This sheet gives you information about how to care for yourself after your procedure. Your health care provider may also give you more specific instructions. If you have problems or questions, contact your health care provider. What can I expect after the procedure? After the procedure, it is common to have:  Abdominal pain.  Some occasional vaginal bleeding (spotting).  Tiredness.  Symptoms of menopause, such as hot flashes, night sweats, or mood swings.  Follow these instructions at home: Incision care  Keep your incision area and your bandage (dressing) clean and dry.  Follow instructions from your health care provider about how to take care of your incision. Make sure you: ? Wash your hands with soap and water before you change your dressing. If soap and water are not available, use hand sanitizer. ? Change your dressing as told by your health care provider. ? Leave stitches (sutures), staples, skin glue, or adhesive strips in place. These skin closures may need to stay in place for 2 weeks or longer. If adhesive strip edges start to loosen and curl up, you may trim the loose edges. Do not remove adhesive strips completely unless your health care provider tells you to do that.  Check your incision area every day for signs of infection. Check for: ? Redness, swelling, or pain. ? Fluid or blood. ? Warmth. ? Pus or a bad smell. Activity  Do not drive or use heavy machinery while taking prescription pain medicine.  Do not drive for 24 hours if you received a medicine to help you relax (sedative) during your procedure.  Take frequent, short walks throughout the day. Rest when you get tired. Ask your health care provider what activities are safe for you.  Avoid activity that requires great effort. Also, avoid heavy lifting. Do not lift anything that is heavier than 10 lbs. (4.5 kg), or the limit that your health care provider tells you, until he or  she says that it is safe to do so.  Do not douche, use tampons, or have sex until your health care provider approves. General instructions  To prevent or treat constipation while you are taking prescription pain medicine, your health care provider may recommend that you: ? Drink enough fluid to keep your urine clear or pale yellow. ? Take over-the-counter or prescription medicines. ? Eat foods that are high in fiber, such as fresh fruits and vegetables, whole grains, and beans. ? Limit foods that are high in fat and processed sugars, such as fried and sweet foods.  Take over-the-counter and prescription medicines only as told by your health care provider.  Do not take baths, swim, or use a hot tub until your health care provider approves. Ask your health care provider if you can take showers. You may only be allowed to take sponge baths for bathing.  Wear compression stockings as told by your health care provider. These stockings help to prevent blood clots and reduce swelling in your legs.  Keep all follow-up visits as told by your health care provider. This is important. Contact a health care provider if:  You have pain when you urinate.  You have pus or a bad smelling discharge coming from your vagina.  You have redness, swelling, or pain around your incision.  You have fluid or blood coming from your incision.  Your incision feels warm to the touch.  You have pus or a bad smell coming from your incision.  You have a fever.  Your incision starts  to break open.  You have pain in the abdomen, and it gets worse or does not get better when you take medicine.  You develop a rash.  You develop nausea and vomiting.  You feel lightheaded. Get help right away if:  You develop pain in your chest or leg.  You become short of breath.  You faint.  You have increased bleeding from your vagina. Summary  After the procedure, it is common to have pain, bleeding in the vagina,  and symptoms of menopause.  Follow instructions from your health care provider about how to take care of your incision.  Follow instructions from your health care provider about activities and restrictions.  Check your incision every day for signs of infection and report any symptoms to your health care provider. This information is not intended to replace advice given to you by your health care provider. Make sure you discuss any questions you have with your health care provider. Document Released: 04/13/2005 Document Revised: 05/18/2016 Document Reviewed: 05/18/2016 Elsevier Interactive Patient Education  [employer].

## 2017-12-14 NOTE — Op Note (Addendum)
OPERATIVE NOTE  Date: 12/14/17  Preoperative Diagnosis: Pelvic mass   Postoperative Diagnosis:  Suspect benign right ovarian cyst, Pelvic adhesive disease  Procedure(s) Performed:  Robotic-assisted laparoscopic right salpingoophorectomy, Pelvic washings Increased procedure difficulty due to pelvic adhesive disease with lysis of adhesions occupying the majority of the procedure ~1.5 hours  Surgeon: Bernita Raisin, MD  Assistant Surgeon: Lahoma Crocker M.D. (an MD assistant was necessary for tissue manipulation, management of robotic instrumentation, retraction and positioning due to the complexity of the case and hospital policies).   Anesthesia: GETA  Specimens:  Right  ovary, right fallopian tube, pelvic washings  Complications: None  Indication for Procedure:  Pelvic mass, increasing in size and complex in nature in postmenopausal patient  Operative Findings: Significant adhesive disease between large bowel and the mass. Also between the mass and the bladder wall. Frozen pathology was consistent with benign cystadenoma, defer to permanent. Incidental ovarian cystotomy. Left ovary non-visible, covered by sigmoid colon and adhesions. Only portion of the left adnexa visible was the fimbria.  Procedure in Detail:  The patient was positioned supine with her arms at her sides prior to anesthesia to ensure comfort. She was placed under general endotracheal anesthesia without difficulty. She was then placed in a dorsolithotomy position and cervical acromial pads were placed. The patient had sequential compression devices for VTE prophylaxis.  The patient was then prepped in the usual sterile fashion.  Time out was performed.   I placed a foley catheter in the urethra and then a sponge on a stick in the vagina.  A 37mm incision was made in the left upper quadrant palmer's point and a 5 mm Optiview trocar used to enter the abdomen under direct visualization. With entry into the  abdomen and then maintenance of 15 mm of mercury the patient was placed in Trendelenburg position. An incision was made superior to the umbilicus and used for an 49mm trocar. To the right of this 2 additional 22mm trocars were placed approximately 6-8 cm from the closest trocar. An 35mm trocar was also placed in the left lateral abdominal wall. 8 mm robotic trochars were inserted. The 81mm LUQ trocar was changed out to 38mm airseal. The robot was docked.  The abdomen was inspected as was the pelvis.  Findings as noted. Pelvic washings were obtained. The mass/cyst was noted on the right. Some of the distal ileum was adherent at the right pelvic brim and this was taken down with sharp dissection. Medially the sigmoid and large bowel was adherent to the left pelvic sidewall and even the anterior pelvis. Sharp and cautery dissection was performed. An incision was then made on the right pelvic side wall peritoneum parallel to the IP ligament and the retroperitoneal space entered. The right ureter was identified and the para-rectal space was developed. A window was created in the broad ligament above the ureter. The infundibulopelvic vessels were skeletonized cauterized and transected. The broad ligament was taken down to the pelvis. Anteriorly and medially more lysis of adhesions was required. The bladder was adherent to the mass as was the sigmoid / rectal wall. Sharp and some gentle blunt dissection was used to take down these adhesions. Attention was then turned to the lateral side of the mass and additional lysis of adhesions continued. At this point an incidental ovarian cystotomy occurred and clear fluid extruded. The decompressed ovary was then elevated and traction placed which allowed continuation of lysis at the inferior border. The mass had one area of attachment with a  small vein what appeared to be the remnant of the utero-ovarian ligament. This was cauterized. With the mass now completely separated the  specimen was placed in an Endo Catch bag. The pelvis was irrigated and inspected and hemostasis was noted. No obvious visceral organ injury. The contralateral ovary was not dissected free pending frozen section. The bag was delivered through the LUQ trocar site after extending the incision slightly.  Frozen section returned as above.   The robot was undocked. The ports were all removed. The fascial closure at the left upper quadrant port was performed with running 0 Vicryl.  All incisions were closed with interrupted 4-0 vicryl and running subcuticular 4-0 Monocryl suture. Dermabond was applied.  Sponge, lap and needle counts were correct x .            Disposition: PACU  Condition: Stable

## 2017-12-14 NOTE — Interval H&P Note (Signed)
History and Physical Interval Note:  12/14/2017 6:56 AM  [name]  has presented today for surgery, with the diagnosis of pelvic mass  The various methods of treatment have been discussed with the patient and family. After consideration of risks, benefits and other options for treatment, the patient has consented to  Procedure(s): XI ROBOTIC ASSISTED BILATERAL SALPINGO OOPHORECTOMY, POSSIBLE LAPAROTOMY AND POSSIBLE STAGING (Bilateral) as a surgical intervention .  The patient's history has been reviewed, patient examined, no change in status, stable for surgery.  I have reviewed the patient's chart and labs.  Questions were answered to the patient's satisfaction.     Sandra Mullins Patient understands may be USO only of affected ovary however if feasible would prefer BSO.

## 2017-12-15 ENCOUNTER — Telehealth: Payer: Self-pay | Admitting: *Deleted

## 2017-12-15 ENCOUNTER — Encounter (HOSPITAL_COMMUNITY): Payer: Self-pay | Admitting: Obstetrics

## 2017-12-15 NOTE — Telephone Encounter (Signed)
I called patient to tell her the results of her final path.  The results of the final surgical path per Joylene John, NP where no cancer.  Patient was very appreciative for the call. Patient states that she is doing good post operatively.  Patient states"  I am very pleased with my progress after surgery, at this time I am having minimum pain, I am able to get around good, no problems with eating or drinking, peeing, or bowel movements".  Patient verbalized understanding.

## 2017-12-19 NOTE — Progress Notes (Signed)
S: Since her last visit on 12/14/17 she underwent laparoscopic RSO (Robo assist), pelvic washings, and LOA due to pelvic adhesive disease. I had planned to take her contralateral (unaffected) ovary or at least tube, however the adhesive disease was so severe that was deemed too risky.  Final pathology revealed: Ovary and fallopian tube, right - BENIGN CYSTADENOFIBROMA, 9.5 CM. - BENIGN UNREMARKABLE FALLOPIAN TUBE. Washings were negative  She is doing well. Feels "empty" but not bothering her. No N/V. Bowels and bladder function normal.  O: There were no vitals taken for this visit.  General :  Well developed, 84 y.o., female in no apparent distress HEENT:  Normocephalic/atraumatic, symmetric, EOMI, eyelids normal Neck:   No visible masses.  Respiratory:  Respirations unlabored, no use of accessory muscles CV:   Deferred Breast:  Deferred Musculoskeletal: Normal muscle strength. Abdomen:  Ecchymosis around incisions, no erythema no signs of drainage or infection. No visible masses or protrusion Extremities:  No visible edema or deformities Skin:   Normal inspection Neuro/Psych:  No focal motor deficit, no abnormal mental status. Normal gait. Normal affect. Alert and oriented to person, place, and time   A/P First postop visit today. We reviewed her operative and pathology reports and she was given a copy for her records. I would like her to return in about a month for a final check before disposition back to her referring doctor.  Cc: Janyth Pupa, DO (Referring Ob/Gyn) Shirline Frees, MD (PCP)

## 2017-12-24 ENCOUNTER — Encounter: Payer: Self-pay | Admitting: Obstetrics

## 2017-12-24 ENCOUNTER — Inpatient Hospital Stay: Payer: PPO | Attending: Obstetrics | Admitting: Obstetrics

## 2017-12-24 VITALS — BP 166/65 | HR 67 | Temp 98.1°F | Resp 18 | Ht 62.0 in | Wt 157.2 lb

## 2017-12-24 DIAGNOSIS — R1900 Intra-abdominal and pelvic swelling, mass and lump, unspecified site: Secondary | ICD-10-CM

## 2017-12-24 DIAGNOSIS — Z9889 Other specified postprocedural states: Secondary | ICD-10-CM

## 2017-12-24 DIAGNOSIS — D279 Benign neoplasm of unspecified ovary: Secondary | ICD-10-CM | POA: Insufficient documentation

## 2017-12-24 NOTE — Patient Instructions (Signed)
1. Return to see me in one month for a final check 2. Still no strenuous activity

## 2018-01-17 NOTE — Progress Notes (Signed)
S: Returns for final postop On 12/14/17 she underwent laparoscopic RSO (Robo assist), pelvic washings, and LOA due to pelvic adhesive disease. I had planned to take her contralateral (unaffected) ovary or at least tube, however the adhesive disease was so severe that was deemed too risky. Final pathology revealed: Ovary and fallopian tube, right - BENIGN CYSTADENOFIBROMA, 9.5 CM. - BENIGN UNREMARKABLE FALLOPIAN TUBE. Washings were negative   She is doing well. No N/V. Bowels and bladder function normal.  O:  Vitals:   01/19/18 0917  BP: (!) 148/80  Pulse: 66  Resp: 16  Temp: 98 F (36.7 C)  SpO2: 98%    General :  Well developed, 84 y.o., female in no apparent distress HEENT:  Normocephalic/atraumatic, symmetric, EOMI, eyelids normal Neck:   No visible masses.  Respiratory:  Respirations unlabored, no use of accessory muscles CV:   Deferred Breast:  Deferred Musculoskeletal: Normal muscle strength. Abdomen:   Wounds healed. no erythema no signs of drainage or infection. No visible masses or protrusion Extremities:  No visible edema or deformities Skin:   Normal inspection Neuro/Psych:  No focal motor deficit, no abnormal mental status. Normal gait. Normal affect. Alert and oriented to person, place, and time   A/P Final postop visit today. We reviewed her operative and pathology reports and she was given a copy for her records last visit I will disposition back to her referring doctor.  Cc: Janyth Pupa, DO (Referring Ob/Gyn) Shirline Frees, MD (PCP)

## 2018-01-19 ENCOUNTER — Inpatient Hospital Stay: Payer: PPO | Attending: Obstetrics | Admitting: Obstetrics

## 2018-01-19 ENCOUNTER — Encounter: Payer: Self-pay | Admitting: Obstetrics

## 2018-01-19 VITALS — BP 148/80 | HR 66 | Temp 98.0°F | Resp 16 | Ht 62.0 in | Wt 153.6 lb

## 2018-01-19 DIAGNOSIS — Z90721 Acquired absence of ovaries, unilateral: Secondary | ICD-10-CM | POA: Insufficient documentation

## 2018-01-19 DIAGNOSIS — D279 Benign neoplasm of unspecified ovary: Secondary | ICD-10-CM | POA: Insufficient documentation

## 2018-01-19 DIAGNOSIS — N83201 Unspecified ovarian cyst, right side: Secondary | ICD-10-CM

## 2018-01-19 NOTE — Patient Instructions (Signed)
Followup annually for a pelvic with Dr. Nelda Marseille and continue to follow with your PCP

## 2018-02-15 DIAGNOSIS — Z23 Encounter for immunization: Secondary | ICD-10-CM | POA: Diagnosis not present

## 2018-02-15 DIAGNOSIS — Z1389 Encounter for screening for other disorder: Secondary | ICD-10-CM | POA: Diagnosis not present

## 2018-02-15 DIAGNOSIS — N951 Menopausal and female climacteric states: Secondary | ICD-10-CM | POA: Diagnosis not present

## 2018-02-15 DIAGNOSIS — L719 Rosacea, unspecified: Secondary | ICD-10-CM | POA: Diagnosis not present

## 2018-02-15 DIAGNOSIS — I10 Essential (primary) hypertension: Secondary | ICD-10-CM | POA: Diagnosis not present

## 2018-02-15 DIAGNOSIS — E7800 Pure hypercholesterolemia, unspecified: Secondary | ICD-10-CM | POA: Diagnosis not present

## 2018-02-28 DIAGNOSIS — N202 Calculus of kidney with calculus of ureter: Secondary | ICD-10-CM | POA: Diagnosis not present

## 2018-06-01 DIAGNOSIS — R590 Localized enlarged lymph nodes: Secondary | ICD-10-CM | POA: Diagnosis not present

## 2018-06-01 DIAGNOSIS — R222 Localized swelling, mass and lump, trunk: Secondary | ICD-10-CM | POA: Diagnosis not present

## 2018-06-02 ENCOUNTER — Other Ambulatory Visit: Payer: Self-pay | Admitting: Family Medicine

## 2018-06-02 DIAGNOSIS — R590 Localized enlarged lymph nodes: Secondary | ICD-10-CM

## 2018-06-02 DIAGNOSIS — R222 Localized swelling, mass and lump, trunk: Secondary | ICD-10-CM

## 2018-06-10 ENCOUNTER — Ambulatory Visit
Admission: RE | Admit: 2018-06-10 | Discharge: 2018-06-10 | Disposition: A | Discharge status: Home / Self Care | Payer: PPO | Source: Ambulatory Visit | Attending: Family Medicine | Admitting: Family Medicine

## 2018-06-10 DIAGNOSIS — R2232 Localized swelling, mass and lump, left upper limb: Secondary | ICD-10-CM | POA: Diagnosis not present

## 2018-06-10 DIAGNOSIS — R222 Localized swelling, mass and lump, trunk: Secondary | ICD-10-CM

## 2018-06-10 DIAGNOSIS — R590 Localized enlarged lymph nodes: Secondary | ICD-10-CM

## 2018-07-27 DIAGNOSIS — E7800 Pure hypercholesterolemia, unspecified: Secondary | ICD-10-CM | POA: Diagnosis not present

## 2018-07-27 DIAGNOSIS — I10 Essential (primary) hypertension: Secondary | ICD-10-CM | POA: Diagnosis not present

## 2018-07-27 DIAGNOSIS — N951 Menopausal and female climacteric states: Secondary | ICD-10-CM | POA: Diagnosis not present

## 2018-07-27 DIAGNOSIS — R599 Enlarged lymph nodes, unspecified: Secondary | ICD-10-CM | POA: Diagnosis not present

## 2018-07-27 DIAGNOSIS — L719 Rosacea, unspecified: Secondary | ICD-10-CM | POA: Diagnosis not present

## 2018-09-12 DIAGNOSIS — Z85828 Personal history of other malignant neoplasm of skin: Secondary | ICD-10-CM | POA: Diagnosis not present

## 2018-09-12 DIAGNOSIS — D225 Melanocytic nevi of trunk: Secondary | ICD-10-CM | POA: Diagnosis not present

## 2018-09-12 DIAGNOSIS — L814 Other melanin hyperpigmentation: Secondary | ICD-10-CM | POA: Diagnosis not present

## 2018-09-12 DIAGNOSIS — L820 Inflamed seborrheic keratosis: Secondary | ICD-10-CM | POA: Diagnosis not present

## 2018-09-12 DIAGNOSIS — S0083XA Contusion of other part of head, initial encounter: Secondary | ICD-10-CM | POA: Diagnosis not present

## 2018-09-12 DIAGNOSIS — L821 Other seborrheic keratosis: Secondary | ICD-10-CM | POA: Diagnosis not present

## 2018-10-31 DIAGNOSIS — E7800 Pure hypercholesterolemia, unspecified: Secondary | ICD-10-CM | POA: Diagnosis not present

## 2018-10-31 DIAGNOSIS — I10 Essential (primary) hypertension: Secondary | ICD-10-CM | POA: Diagnosis not present

## 2019-01-24 DIAGNOSIS — Z79890 Hormone replacement therapy (postmenopausal): Secondary | ICD-10-CM | POA: Diagnosis not present

## 2019-01-24 DIAGNOSIS — Z780 Asymptomatic menopausal state: Secondary | ICD-10-CM | POA: Diagnosis not present

## 2019-02-23 DIAGNOSIS — H524 Presbyopia: Secondary | ICD-10-CM | POA: Diagnosis not present

## 2019-05-11 DIAGNOSIS — Z0000 Encounter for general adult medical examination without abnormal findings: Secondary | ICD-10-CM | POA: Diagnosis not present

## 2019-05-11 DIAGNOSIS — I10 Essential (primary) hypertension: Secondary | ICD-10-CM | POA: Diagnosis not present

## 2019-05-11 DIAGNOSIS — E7800 Pure hypercholesterolemia, unspecified: Secondary | ICD-10-CM | POA: Diagnosis not present

## 2019-05-11 DIAGNOSIS — L719 Rosacea, unspecified: Secondary | ICD-10-CM | POA: Diagnosis not present

## 2019-05-12 DIAGNOSIS — I10 Essential (primary) hypertension: Secondary | ICD-10-CM | POA: Diagnosis not present

## 2019-05-12 DIAGNOSIS — E7800 Pure hypercholesterolemia, unspecified: Secondary | ICD-10-CM | POA: Diagnosis not present

## 2019-11-08 DIAGNOSIS — L719 Rosacea, unspecified: Secondary | ICD-10-CM | POA: Diagnosis not present

## 2019-11-08 DIAGNOSIS — I10 Essential (primary) hypertension: Secondary | ICD-10-CM | POA: Diagnosis not present

## 2019-11-08 DIAGNOSIS — L308 Other specified dermatitis: Secondary | ICD-10-CM | POA: Diagnosis not present

## 2019-11-08 DIAGNOSIS — N1831 Chronic kidney disease, stage 3a: Secondary | ICD-10-CM | POA: Diagnosis not present

## 2019-11-08 DIAGNOSIS — E7800 Pure hypercholesterolemia, unspecified: Secondary | ICD-10-CM | POA: Diagnosis not present

## 2020-01-25 DIAGNOSIS — N951 Menopausal and female climacteric states: Secondary | ICD-10-CM | POA: Diagnosis not present

## 2020-01-25 DIAGNOSIS — L309 Dermatitis, unspecified: Secondary | ICD-10-CM | POA: Diagnosis not present

## 2020-02-18 IMAGING — US US PELVIS COMPLETE TRANSABD/TRANSVAG
1 series · 13 of 25 positions shown · non-contrast
Comparison: Abdominal and pelvic CT scan October 22, 2017

CLINICAL DATA: Follow-up of right adnexal mass demonstrated on CT
scan of the abdomen and pelvis October 22, 2017. History of previous
hysterectomy. No for ectomy.

EXAM:
TRANSABDOMINAL AND TRANSVAGINAL ULTRASOUND OF PELVIS
TECHNIQUE: Both transabdominal and transvaginal ultrasound examinations of the
pelvis were performed. Transabdominal technique was performed for
global imaging of the pelvis including uterus, ovaries, adnexal
regions, and pelvic cul-de-sac. It was necessary to proceed with
endovaginal exam following the transabdominal exam to visualize the
ovaries and adnexal structures..

[Series 1: us pelvis complete transabd/transvag · 0.25mm/px · 42 acquisitions, 13 frames shown]
[im 1/42]
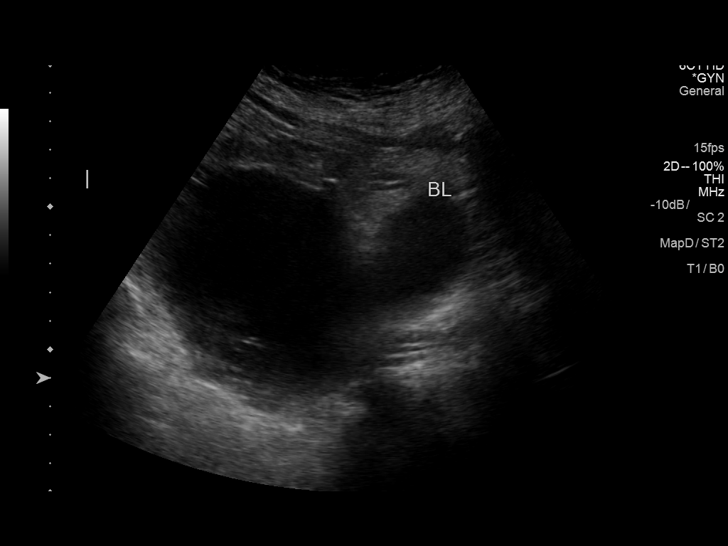
[im 4/42]
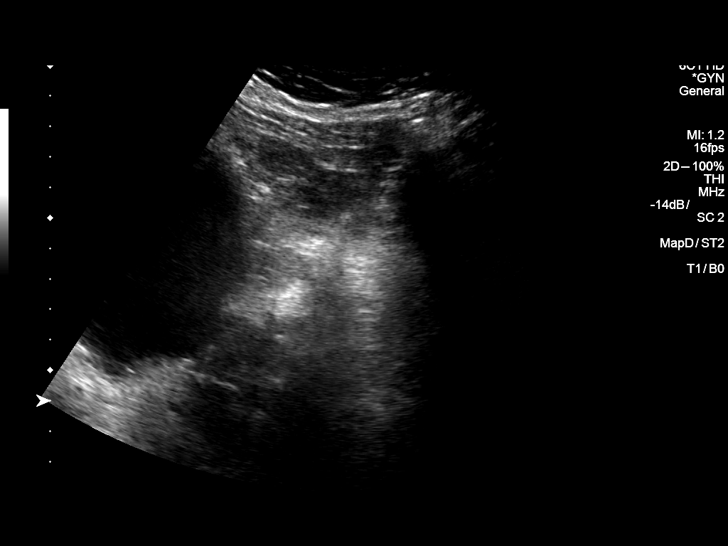
[im 7/42]
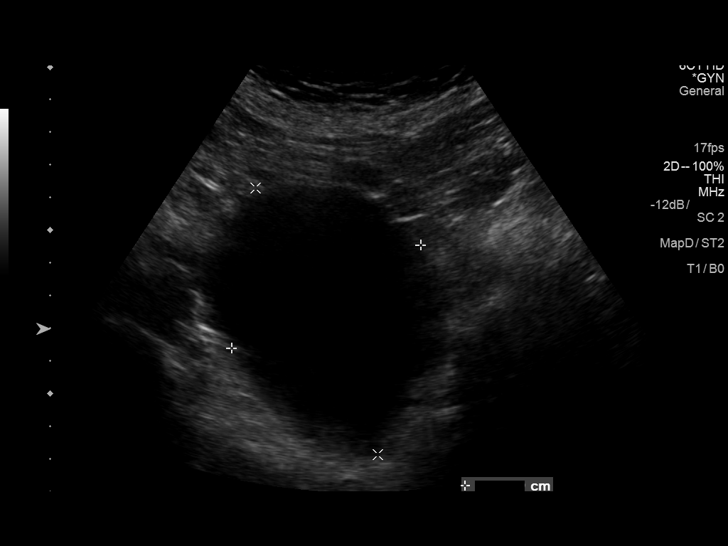
[im 11/42]
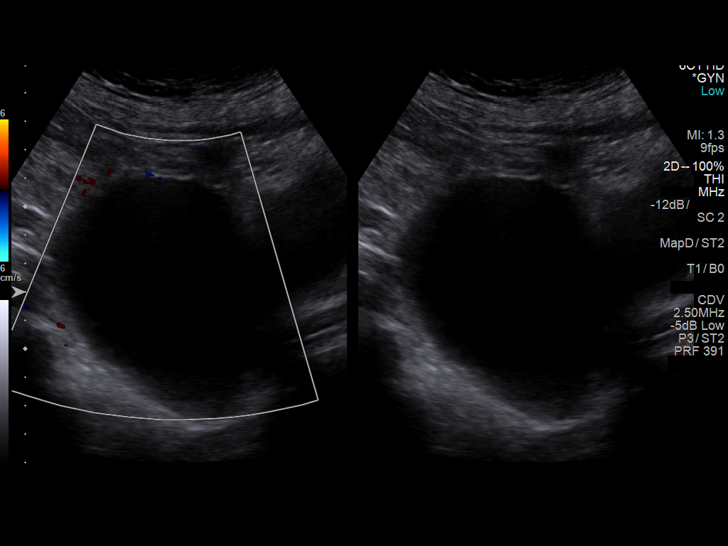
[im 14/42]
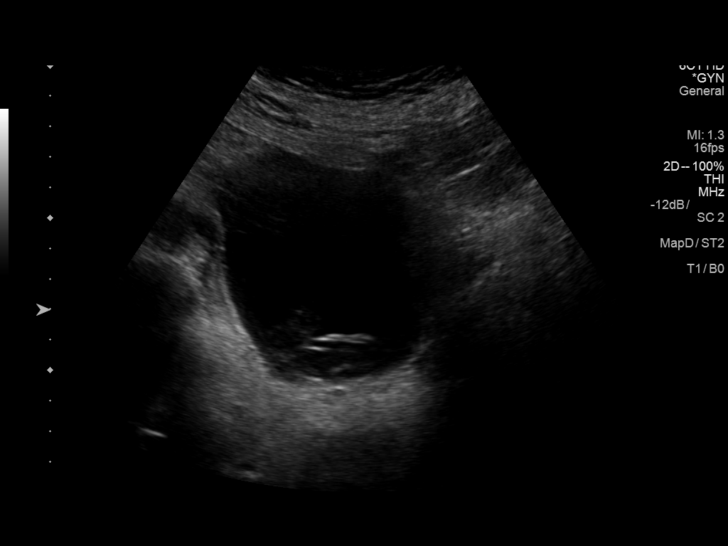
[im 18/42]
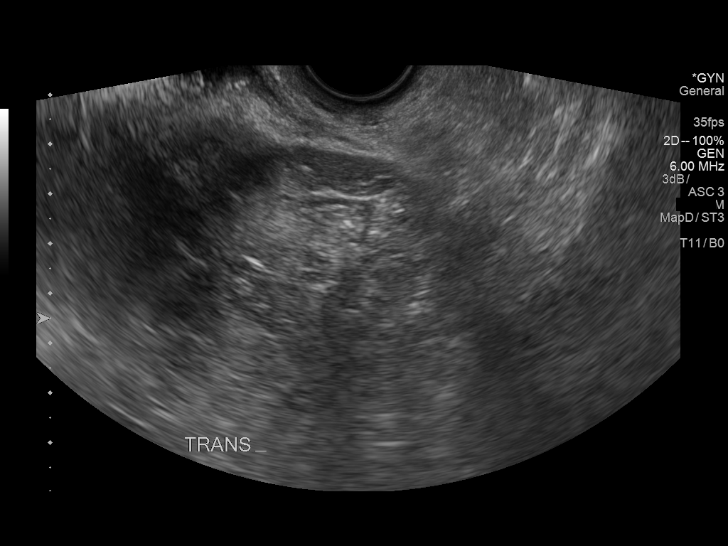
[im 21/42]
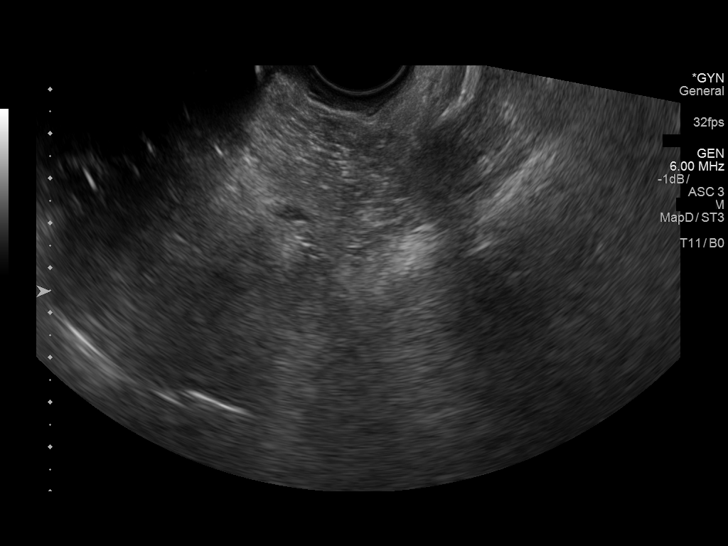
[im 24/42]
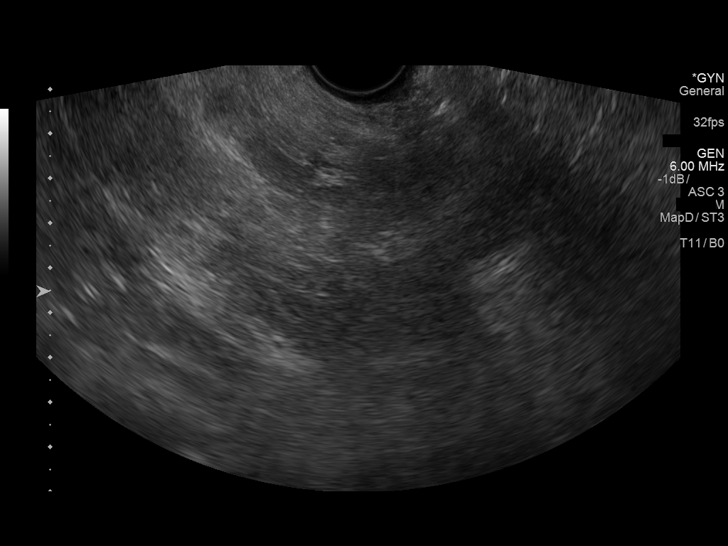
[im 28/42]
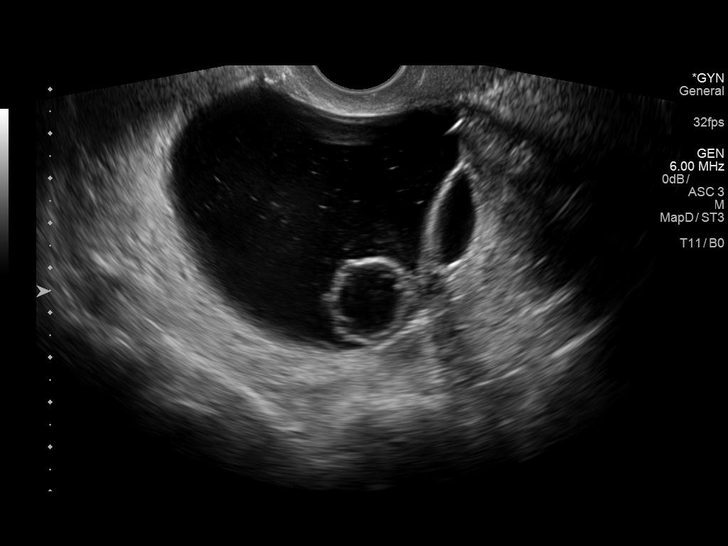
[im 31/42]
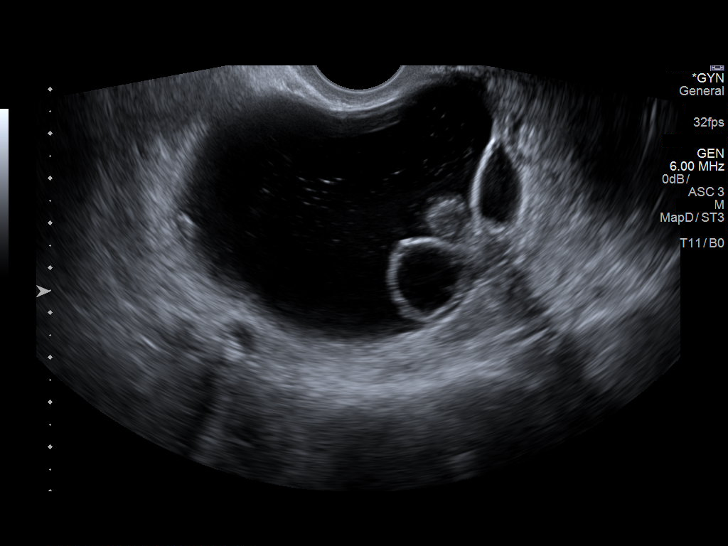
[im 35/42]
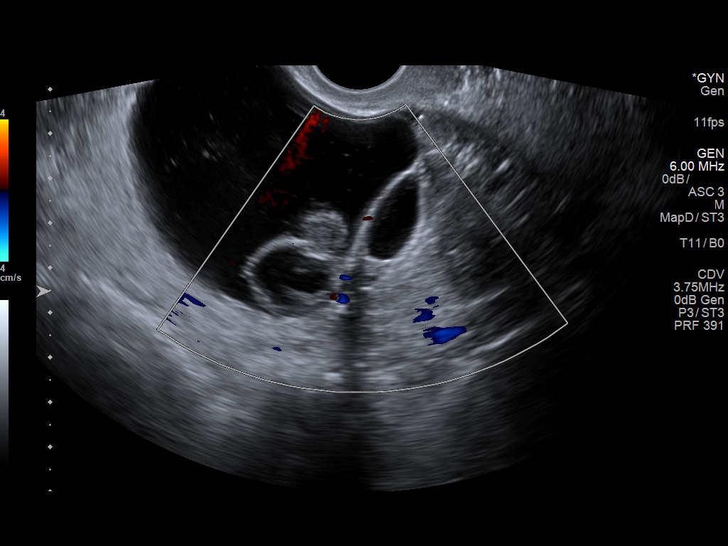
[im 38/42]
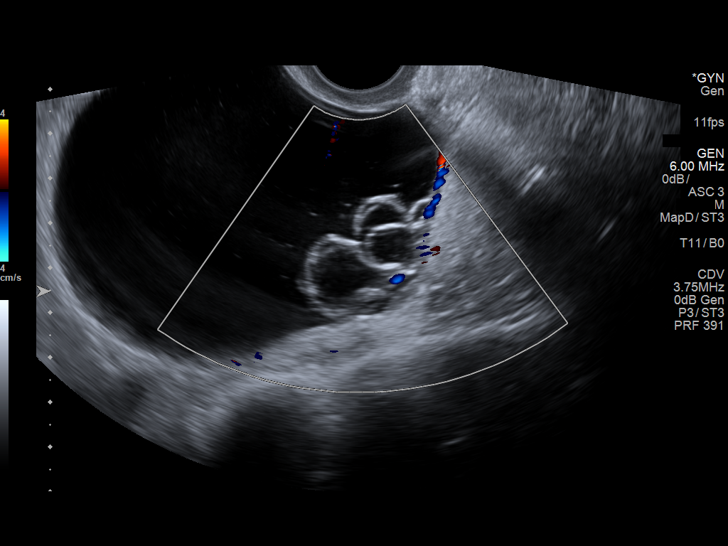
[im 42/42]
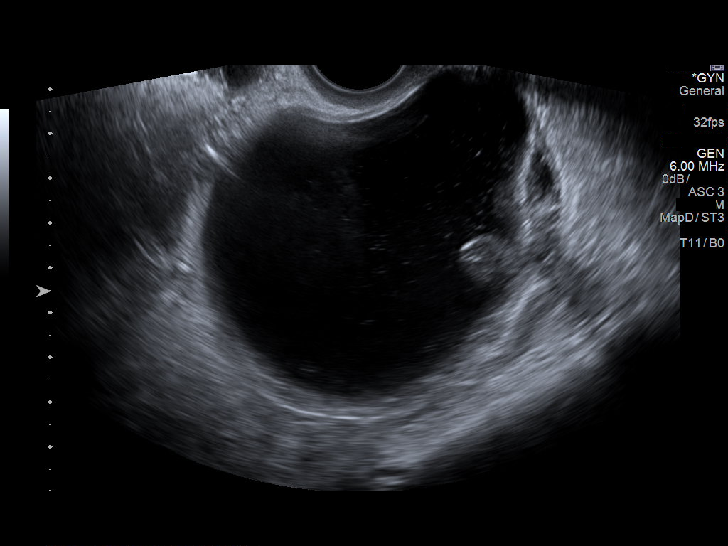

[13 of 25 positions shown; findings below may reference images not displayed]

FINDINGS: Uterus

The uterus and endometrium are surgically absent.

Right ovary

The right ovary could not be visualized.

Left ovary

The left ovary could not be visualized.

Other findings there is a complex appearing right adnexal mass
measuring 7.0 x 7.0 by a 0.5 cm there are multiple septations with
some daughter cyst type appearance. There is some mobile debris
within the cystic mass. There is some vascularity along one portion
of the mass.
IMPRESSION: Abnormal complex appearing cystic right adnexal mass worrisome for
neoplasm either benign or malignant. Gynecological evaluation is
recommended.

The uterus is surgically absent. The ovaries could not be discretely
identified.

## 2020-05-31 DIAGNOSIS — N1832 Chronic kidney disease, stage 3b: Secondary | ICD-10-CM | POA: Diagnosis not present

## 2020-05-31 DIAGNOSIS — Z0000 Encounter for general adult medical examination without abnormal findings: Secondary | ICD-10-CM | POA: Diagnosis not present

## 2020-05-31 DIAGNOSIS — I10 Essential (primary) hypertension: Secondary | ICD-10-CM | POA: Diagnosis not present

## 2020-05-31 DIAGNOSIS — E7800 Pure hypercholesterolemia, unspecified: Secondary | ICD-10-CM | POA: Diagnosis not present

## 2020-05-31 DIAGNOSIS — L308 Other specified dermatitis: Secondary | ICD-10-CM | POA: Diagnosis not present

## 2020-05-31 DIAGNOSIS — R42 Dizziness and giddiness: Secondary | ICD-10-CM | POA: Diagnosis not present

## 2020-05-31 DIAGNOSIS — L719 Rosacea, unspecified: Secondary | ICD-10-CM | POA: Diagnosis not present

## 2020-06-13 DIAGNOSIS — B373 Candidiasis of vulva and vagina: Secondary | ICD-10-CM | POA: Diagnosis not present

## 2020-06-13 DIAGNOSIS — R32 Unspecified urinary incontinence: Secondary | ICD-10-CM | POA: Diagnosis not present

## 2020-09-11 DIAGNOSIS — L820 Inflamed seborrheic keratosis: Secondary | ICD-10-CM | POA: Diagnosis not present

## 2020-09-11 DIAGNOSIS — Z85828 Personal history of other malignant neoplasm of skin: Secondary | ICD-10-CM | POA: Diagnosis not present

## 2020-09-11 DIAGNOSIS — L300 Nummular dermatitis: Secondary | ICD-10-CM | POA: Diagnosis not present

## 2020-09-11 DIAGNOSIS — L219 Seborrheic dermatitis, unspecified: Secondary | ICD-10-CM | POA: Diagnosis not present

## 2020-09-11 DIAGNOSIS — L814 Other melanin hyperpigmentation: Secondary | ICD-10-CM | POA: Diagnosis not present

## 2020-09-11 DIAGNOSIS — D225 Melanocytic nevi of trunk: Secondary | ICD-10-CM | POA: Diagnosis not present

## 2020-09-11 DIAGNOSIS — L821 Other seborrheic keratosis: Secondary | ICD-10-CM | POA: Diagnosis not present

## 2020-12-03 DIAGNOSIS — E7800 Pure hypercholesterolemia, unspecified: Secondary | ICD-10-CM | POA: Diagnosis not present

## 2020-12-03 DIAGNOSIS — L719 Rosacea, unspecified: Secondary | ICD-10-CM | POA: Diagnosis not present

## 2020-12-03 DIAGNOSIS — I10 Essential (primary) hypertension: Secondary | ICD-10-CM | POA: Diagnosis not present

## 2020-12-03 DIAGNOSIS — N1832 Chronic kidney disease, stage 3b: Secondary | ICD-10-CM | POA: Diagnosis not present

## 2020-12-03 DIAGNOSIS — L820 Inflamed seborrheic keratosis: Secondary | ICD-10-CM | POA: Diagnosis not present

## 2021-04-12 ENCOUNTER — Emergency Department (HOSPITAL_COMMUNITY)
Admission: EM | Admit: 2021-04-12 | Discharge: 2021-04-12 | Disposition: A | Discharge status: Home / Self Care | Payer: PPO | Attending: Emergency Medicine | Admitting: Emergency Medicine

## 2021-04-12 ENCOUNTER — Encounter (HOSPITAL_COMMUNITY): Payer: Self-pay | Admitting: Emergency Medicine

## 2021-04-12 ENCOUNTER — Emergency Department (HOSPITAL_COMMUNITY): Payer: PPO

## 2021-04-12 DIAGNOSIS — Z87891 Personal history of nicotine dependence: Secondary | ICD-10-CM | POA: Insufficient documentation

## 2021-04-12 DIAGNOSIS — W19XXXA Unspecified fall, initial encounter: Secondary | ICD-10-CM | POA: Insufficient documentation

## 2021-04-12 DIAGNOSIS — S0990XA Unspecified injury of head, initial encounter: Secondary | ICD-10-CM | POA: Diagnosis not present

## 2021-04-12 DIAGNOSIS — I10 Essential (primary) hypertension: Secondary | ICD-10-CM | POA: Diagnosis not present

## 2021-04-12 DIAGNOSIS — S0101XA Laceration without foreign body of scalp, initial encounter: Secondary | ICD-10-CM | POA: Insufficient documentation

## 2021-04-12 DIAGNOSIS — R55 Syncope and collapse: Secondary | ICD-10-CM | POA: Insufficient documentation

## 2021-04-12 DIAGNOSIS — Z23 Encounter for immunization: Secondary | ICD-10-CM | POA: Diagnosis not present

## 2021-04-12 DIAGNOSIS — M549 Dorsalgia, unspecified: Secondary | ICD-10-CM | POA: Diagnosis not present

## 2021-04-12 DIAGNOSIS — M47812 Spondylosis without myelopathy or radiculopathy, cervical region: Secondary | ICD-10-CM | POA: Diagnosis not present

## 2021-04-12 DIAGNOSIS — M4312 Spondylolisthesis, cervical region: Secondary | ICD-10-CM | POA: Diagnosis not present

## 2021-04-12 DIAGNOSIS — I499 Cardiac arrhythmia, unspecified: Secondary | ICD-10-CM | POA: Diagnosis not present

## 2021-04-12 DIAGNOSIS — S199XXA Unspecified injury of neck, initial encounter: Secondary | ICD-10-CM | POA: Diagnosis not present

## 2021-04-12 DIAGNOSIS — I959 Hypotension, unspecified: Secondary | ICD-10-CM | POA: Diagnosis not present

## 2021-04-12 LAB — COMPREHENSIVE METABOLIC PANEL
ALT: 19 U/L (ref 0–44)
AST: 37 U/L (ref 15–41)
Albumin: 3.5 g/dL (ref 3.5–5.0)
Alkaline Phosphatase: 55 U/L (ref 38–126)
Anion gap: 9 (ref 5–15)
BUN: 26 mg/dL — ABNORMAL HIGH (ref 8–23)
CO2: 25 mmol/L (ref 22–32)
Calcium: 8.9 mg/dL (ref 8.9–10.3)
Chloride: 99 mmol/L (ref 98–111)
Creatinine, Ser: 1.24 mg/dL — ABNORMAL HIGH (ref 0.44–1.00)
GFR, Estimated: 42 mL/min — ABNORMAL LOW (ref >60)
Glucose, Bld: 117 mg/dL — ABNORMAL HIGH (ref 70–99)
Potassium: 3.5 mmol/L (ref 3.5–5.1)
Sodium: 133 mmol/L — ABNORMAL LOW (ref 135–145)
Total Bilirubin: 1 mg/dL (ref 0.3–1.2)
Total Protein: 6.9 g/dL (ref 6.5–8.1)

## 2021-04-12 LAB — CBC
HCT: 38.7 % (ref 36.0–46.0)
Hemoglobin: 12.6 g/dL (ref 12.0–15.0)
MCH: 30.7 pg (ref 26.0–34.0)
MCHC: 32.6 g/dL (ref 30.0–36.0)
MCV: 94.2 fL (ref 80.0–100.0)
Platelets: 139 10*3/uL — ABNORMAL LOW (ref 150–400)
RBC: 4.11 MIL/uL (ref 3.87–5.11)
RDW: 13 % (ref 11.5–15.5)
WBC: 6.6 10*3/uL (ref 4.0–10.5)
nRBC: 0 % (ref 0.0–0.2)

## 2021-04-12 MED ORDER — TETANUS-DIPHTH-ACELL PERTUSSIS 5-2.5-18.5 LF-MCG/0.5 IM SUSY
0.5000 mL | PREFILLED_SYRINGE | Freq: Once | INTRAMUSCULAR | Status: AC
  Administered 2021-04-12: 0.5 mL via INTRAMUSCULAR
  Filled 2021-04-12: qty 0.5

## 2021-04-12 MED ORDER — LIDOCAINE HCL (PF) 1 % IJ SOLN
5.0000 mL | Freq: Once | INTRAMUSCULAR | Status: DC
  Filled 2021-04-12: qty 5

## 2021-04-12 NOTE — ED Triage Notes (Addendum)
Pt bib EMS from [place] [facility]. Unwitnessed fall, pt recalls standing at bathroom sink and then waking up on the floor. Possible LOC, not on blood thinners. EMS noted bleeding and possible hematoma to the occipital region of pt's head. Denies acute pain. C-collar in place for precautions. Pt endorses not eating for the past several days due to lack of appetite.   EMS vitals: 146/64 HR 76 95% room air CBG 154  20G IV in R AC vein by EMS, no meds/fluids given

## 2021-04-12 NOTE — ED Notes (Signed)
Multiple attempts made to reach [place] at [facility] to give report about patient. Left voicemail with phone number for call back.

## 2021-04-12 NOTE — ED Provider Notes (Signed)
[facility] Emergency Department Provider Note MRN:  [id number]  Arrival date & time: 04/12/21     Chief Complaint   syncope  History of Present Illness   Sandra Mullins is a 87 y.o. year-old female with a history of HTN presenting to the ED with chief complaint of syncope.  Patient has been having loose stool for the past 2 days with very little warning and so has had a few episodes of bowel incontinence.  This occurred this evening while in bed.  She got out of bed to go clean herself up.  She was in the bathroom, was turning to go wash her hands and then she suddenly became lightheaded and then the next thing she remembers is waking up on the ground facing the wall.  Trauma to the back of the head with some bleeding.  She denies any chest pain or shortness of breath before or after the fall.  Currently denies any headache, no vision change, no speech issues, no trouble swallowing, no neck or back pain.  Has not had any back pain or numbness or weakness to the arms or legs recently.  No abdominal pain.  Currently feels well other than some soreness to the back of the head.  Review of Systems  A complete 10 system review of systems was obtained and all systems are negative except as noted in the HPI and PMH.   Patient's Health History    Past Medical History:  Diagnosis Date   History of kidney stones    History of lower GI bleeding 05/2013   due to colitis   Hypertension    Left ureteral stone    Nephrolithiasis    bilateral nonobstructive per CT 10-22-2017    Past Surgical History:  Procedure Laterality Date   ABDOMINAL HYSTERECTOMY  1970s   due to menorrhagia ; per pt retained ovaries   APPENDECTOMY  age 89   Open appy. States no rupture   CATARACT EXTRACTION     Unsure of the date   CYSTOSCOPY WITH RETROGRADE PYELOGRAM, URETEROSCOPY AND STENT PLACEMENT Left 11/12/2017   Procedure: CYSTOSCOPY WITH RETROGRADE PYELOGRAM, URETEROSCOPY AND STENT  PLACEMENT;  Surgeon: Alexis Frock, MD;  Location: [facility];  Service: Urology;  Laterality: Left;   DILATION AND CURETTAGE OF UTERUS  x2  yrs ago   HOLMIUM LASER APPLICATION Left 9/93/7169   Procedure: HOLMIUM LASER APPLICATION;  Surgeon: Alexis Frock, MD;  Location: [facility];  Service: Urology;  Laterality: Left;   ROBOTIC ASSISTED BILATERAL SALPINGO OOPHERECTOMY Right 12/14/2017   Procedure: XI ROBOTIC ASSISTED RIGHT SALPINGO OOPHORECTOMY WITH LYSIS OF ADHESIONS;  Surgeon: Isabel Caprice, MD;  Location: [facility];  Service: Gynecology;  Laterality: Right;    Family History  Problem Relation Age of Onset   Lung cancer Mother        smoker   COPD Father    Ovarian cancer Sister        was not offered surgery. Was in her late 21's    Social History   Socioeconomic History   Marital status: Widowed    Spouse name: Not on file   Number of children: Not on file   Years of education: Not on file   Highest education level: Not on file  Occupational History   Not on file  Tobacco Use   Smoking status: Former    Types: Cigarettes    Quit date: 06/09/1971    Years since quitting: 65.8  Smokeless tobacco: Never   Tobacco comments:    smoked about 15 years; quit 1970's  Vaping Use   Vaping Use: Never used  Substance and Sexual Activity   Alcohol use: No   Drug use: No   Sexual activity: Not on file  Other Topics Concern   Not on file  Social History Narrative   Not on file   Social Determinants of Health   Financial Resource Strain: Not on file  Food Insecurity: Not on file  Transportation Needs: Not on file  Physical Activity: Not on file  Stress: Not on file  Social Connections: Not on file  Intimate Partner Violence: Not on file     Physical Exam   Vitals:   04/12/21 0530 04/12/21 0600  BP: 137/75 (!) 144/74  Pulse: 75 75  Resp: (!) 27 (!) 25  Temp:    SpO2: 92% 94%    CONSTITUTIONAL: Well-appearing, NAD NEURO:  Alert  and oriented x 3, no focal deficits EYES:  eyes equal and reactive ENT/NECK:  no LAD, no JVD CARDIO: Regular rate, well-perfused, normal S1 and S2 PULM:  CTAB no wheezing or rhonchi GI/GU:  normal bowel sounds, non-distended, non-tender MSK/SPINE:  No gross deformities, no edema SKIN: 1 inch laceration to the occipital scalp PSYCH:  Appropriate speech and behavior  *Additional and/or pertinent findings included in MDM below  Diagnostic and Interventional Summary    EKG Interpretation  Date/Time:  Saturday April 12 2021 03:56:35 EST Ventricular Rate:  67 PR Interval:  174 QRS Duration: 111 QT Interval:  400 QTC Calculation: 423 R Axis:   64 Text Interpretation: Sinus rhythm Atrial premature complexes Confirmed by Gerlene Fee [phone]) on 04/12/2021 4:08:44 AM       Labs Reviewed  CBC - Abnormal; Notable for the following components:      Result Value   Platelets 139 (*)    All other components within normal limits  COMPREHENSIVE METABOLIC PANEL - Abnormal; Notable for the following components:   Sodium 133 (*)    Glucose, Bld 117 (*)    BUN 26 (*)    Creatinine, Ser 1.24 (*)    GFR, Estimated 42 (*)    All other components within normal limits    CT HEAD WO CONTRAST (5MM)  Final Result    CT CERVICAL SPINE WO CONTRAST  Final Result      Medications  lidocaine (PF) (XYLOCAINE) 1 % injection 5 mL (has no administration in time range)  Tdap (BOOSTRIX) injection 0.5 mL (0.5 mLs Intramuscular Given 04/12/21 0552)     Procedures  /  Critical Care .Marland KitchenLaceration Repair  Date/Time: 04/12/2021 6:56 AM Performed by: Maudie Flakes, MD Authorized by: Maudie Flakes, MD   Consent:    Consent obtained:  Verbal   Consent given by:  Patient   Risks, benefits, and alternatives were discussed: yes     Risks discussed:  Infection, need for additional repair, pain, poor cosmetic result, poor wound healing, nerve damage, vascular damage, tendon damage and retained  foreign body   Alternatives discussed:  No treatment Universal protocol:    Procedure explained and questions answered to patient or proxy's satisfaction: yes     Immediately prior to procedure, a time out was called: yes     Patient identity confirmed:  Verbally with patient Anesthesia:    Anesthesia method:  Local infiltration   Local anesthetic:  Lidocaine 1% w/o epi Laceration details:    Location:  Scalp   Scalp location:  Occipital   Length (cm):  2.5   Depth (mm):  3 Pre-procedure details:    Preparation:  Patient was prepped and draped in usual sterile fashion Exploration:    Hemostasis achieved with:  Direct pressure   Imaging outcome: foreign body not noted     Wound exploration: wound explored through full range of motion and entire depth of wound visualized     Contaminated: no   Treatment:    Area cleansed with:  Saline   Amount of cleaning:  Standard   Debridement:  None   Undermining:  None Skin repair:    Repair method:  Staples   Number of staples:  2 Approximation:    Approximation:  Close Repair type:    Repair type:  Simple Post-procedure details:    Dressing:  Open (no dressing)   Procedure completion:  Tolerated well, no immediate complications  ED Course and Medical Decision Making  I have reviewed the triage vital signs, the nursing notes, and pertinent available records from the EMR.  Listed above are laboratory and imaging tests that I personally ordered, reviewed, and interpreted and then considered in my medical decision making (see below for details).  Syncope, head trauma.  Will need laceration repair.  Will obtain EKG and basic labs.  No evidence of DVT on exam.  Doubt PE.  Not having any chest pain.  Will place on cardiac monitor to screen for any arrhythmias.  Does not have any significant heart history and so was not a high risk syncope, potentially a candidate for discharge.  Favoring a benign form of syncope.  Bowel incontinence recently  likely related to loose stool or diarrhea.  She is not exhibiting any signs or symptoms of myelopathy, normal neurological exam at this time.  CT head will be obtained to exclude intracranial injury.     Work-up reassuring, laceration repaired as described above, appropriate for discharge.  Barth Kirks. Sedonia Small, [place] [email or web address]  Final Clinical Impressions(s) / ED Diagnoses     ICD-10-CM   1. Syncope, unspecified syncope type  R55     2. Laceration of scalp, initial encounter  [email or web address]       ED Discharge Orders     None        Discharge Instructions Discussed with and Provided to Patient:     Discharge Instructions      You were evaluated in the Emergency Department and after careful evaluation, we did not find any emergent condition requiring admission or further testing in the hospital.  Your exam/testing today was overall reassuring.  Symptoms seem to be due to a syncopal episode.  Recommend close follow-up with your regular doctor.  Your scalp staples will need to be removed in 10 to 14 days by healthcare professional.  Please return to the Emergency Department if you experience any worsening of your condition.  Thank you for allowing [place] to be a part of your care.         Maudie Flakes, MD 04/12/21 [phone]

## 2021-04-12 NOTE — ED Notes (Signed)
Pt discharged and leaving with son at this time.

## 2021-04-12 NOTE — Discharge Instructions (Signed)
You were evaluated in the Emergency Department and after careful evaluation, we did not find any emergent condition requiring admission or further testing in the hospital.  Your exam/testing today was overall reassuring.  Symptoms seem to be due to a syncopal episode.  Recommend close follow-up with your regular doctor.  Your scalp staples will need to be removed in 10 to 14 days by healthcare professional.  Please return to the Emergency Department if you experience any worsening of your condition.  Thank you for allowing [place] to be a part of your care.

## 2021-04-15 DIAGNOSIS — J209 Acute bronchitis, unspecified: Secondary | ICD-10-CM | POA: Diagnosis not present

## 2021-04-15 DIAGNOSIS — S0101XD Laceration without foreign body of scalp, subsequent encounter: Secondary | ICD-10-CM | POA: Diagnosis not present

## 2021-04-15 DIAGNOSIS — R55 Syncope and collapse: Secondary | ICD-10-CM | POA: Diagnosis not present

## 2021-04-24 DIAGNOSIS — R059 Cough, unspecified: Secondary | ICD-10-CM | POA: Diagnosis not present

## 2021-04-24 DIAGNOSIS — Z4802 Encounter for removal of sutures: Secondary | ICD-10-CM | POA: Diagnosis not present

## 2021-04-24 DIAGNOSIS — J40 Bronchitis, not specified as acute or chronic: Secondary | ICD-10-CM | POA: Diagnosis not present

## 2021-06-03 DIAGNOSIS — I10 Essential (primary) hypertension: Secondary | ICD-10-CM | POA: Diagnosis not present

## 2021-06-03 DIAGNOSIS — E7800 Pure hypercholesterolemia, unspecified: Secondary | ICD-10-CM | POA: Diagnosis not present

## 2021-06-03 DIAGNOSIS — N390 Urinary tract infection, site not specified: Secondary | ICD-10-CM | POA: Diagnosis not present

## 2021-06-03 DIAGNOSIS — N1832 Chronic kidney disease, stage 3b: Secondary | ICD-10-CM | POA: Diagnosis not present

## 2021-06-03 DIAGNOSIS — L719 Rosacea, unspecified: Secondary | ICD-10-CM | POA: Diagnosis not present

## 2021-06-03 DIAGNOSIS — Z0000 Encounter for general adult medical examination without abnormal findings: Secondary | ICD-10-CM | POA: Diagnosis not present

## 2021-08-25 DIAGNOSIS — R21 Rash and other nonspecific skin eruption: Secondary | ICD-10-CM | POA: Diagnosis not present

## 2021-08-25 DIAGNOSIS — R42 Dizziness and giddiness: Secondary | ICD-10-CM | POA: Diagnosis not present

## 2021-08-25 DIAGNOSIS — H8110 Benign paroxysmal vertigo, unspecified ear: Secondary | ICD-10-CM | POA: Diagnosis not present

## 2021-08-25 DIAGNOSIS — J302 Other seasonal allergic rhinitis: Secondary | ICD-10-CM | POA: Diagnosis not present

## 2021-12-12 DIAGNOSIS — I10 Essential (primary) hypertension: Secondary | ICD-10-CM | POA: Diagnosis not present

## 2021-12-12 DIAGNOSIS — L719 Rosacea, unspecified: Secondary | ICD-10-CM | POA: Diagnosis not present

## 2021-12-12 DIAGNOSIS — N1832 Chronic kidney disease, stage 3b: Secondary | ICD-10-CM | POA: Diagnosis not present

## 2021-12-12 DIAGNOSIS — E7800 Pure hypercholesterolemia, unspecified: Secondary | ICD-10-CM | POA: Diagnosis not present

## 2022-03-26 ENCOUNTER — Emergency Department (HOSPITAL_BASED_OUTPATIENT_CLINIC_OR_DEPARTMENT_OTHER): Payer: PPO

## 2022-03-26 ENCOUNTER — Emergency Department (HOSPITAL_COMMUNITY): Payer: PPO

## 2022-03-26 ENCOUNTER — Emergency Department (HOSPITAL_COMMUNITY)
Admission: EM | Admit: 2022-03-26 | Discharge: 2022-03-26 | Disposition: A | Discharge status: Skilled Nursing Facility | Payer: PPO | Attending: Emergency Medicine | Admitting: Emergency Medicine

## 2022-03-26 ENCOUNTER — Other Ambulatory Visit: Payer: Self-pay

## 2022-03-26 DIAGNOSIS — R42 Dizziness and giddiness: Secondary | ICD-10-CM | POA: Insufficient documentation

## 2022-03-26 DIAGNOSIS — R079 Chest pain, unspecified: Secondary | ICD-10-CM

## 2022-03-26 DIAGNOSIS — I10 Essential (primary) hypertension: Secondary | ICD-10-CM | POA: Diagnosis not present

## 2022-03-26 DIAGNOSIS — J984 Other disorders of lung: Secondary | ICD-10-CM | POA: Diagnosis not present

## 2022-03-26 DIAGNOSIS — Z7982 Long term (current) use of aspirin: Secondary | ICD-10-CM | POA: Insufficient documentation

## 2022-03-26 DIAGNOSIS — Z79899 Other long term (current) drug therapy: Secondary | ICD-10-CM | POA: Insufficient documentation

## 2022-03-26 DIAGNOSIS — I2510 Atherosclerotic heart disease of native coronary artery without angina pectoris: Secondary | ICD-10-CM | POA: Diagnosis not present

## 2022-03-26 DIAGNOSIS — J929 Pleural plaque without asbestos: Secondary | ICD-10-CM | POA: Diagnosis not present

## 2022-03-26 DIAGNOSIS — R0689 Other abnormalities of breathing: Secondary | ICD-10-CM | POA: Diagnosis not present

## 2022-03-26 DIAGNOSIS — R0789 Other chest pain: Secondary | ICD-10-CM | POA: Insufficient documentation

## 2022-03-26 DIAGNOSIS — I959 Hypotension, unspecified: Secondary | ICD-10-CM | POA: Diagnosis not present

## 2022-03-26 DIAGNOSIS — J40 Bronchitis, not specified as acute or chronic: Secondary | ICD-10-CM | POA: Diagnosis not present

## 2022-03-26 LAB — TROPONIN I (HIGH SENSITIVITY)
Troponin I (High Sensitivity): 5 ng/L (ref <18)
Troponin I (High Sensitivity): 6 ng/L (ref <18)

## 2022-03-26 LAB — COMPREHENSIVE METABOLIC PANEL
ALT: 16 U/L (ref 0–44)
AST: 26 U/L (ref 15–41)
Albumin: 3.4 g/dL — ABNORMAL LOW (ref 3.5–5.0)
Alkaline Phosphatase: 52 U/L (ref 38–126)
Anion gap: 11 (ref 5–15)
BUN: 20 mg/dL (ref 8–23)
CO2: 26 mmol/L (ref 22–32)
Calcium: 9.6 mg/dL (ref 8.9–10.3)
Chloride: 104 mmol/L (ref 98–111)
Creatinine, Ser: 1.02 mg/dL — ABNORMAL HIGH (ref 0.44–1.00)
GFR, Estimated: 53 mL/min — ABNORMAL LOW (ref >60)
Glucose, Bld: 108 mg/dL — ABNORMAL HIGH (ref 70–99)
Potassium: 3.5 mmol/L (ref 3.5–5.1)
Sodium: 141 mmol/L (ref 135–145)
Total Bilirubin: 0.2 mg/dL — ABNORMAL LOW (ref 0.3–1.2)
Total Protein: 6.7 g/dL (ref 6.5–8.1)

## 2022-03-26 LAB — CBC WITH DIFFERENTIAL/PLATELET
Abs Immature Granulocytes: 0.02 10*3/uL (ref 0.00–0.07)
Basophils Absolute: 0.1 10*3/uL (ref 0.0–0.1)
Basophils Relative: 1 %
Eosinophils Absolute: 0.2 10*3/uL (ref 0.0–0.5)
Eosinophils Relative: 2 %
HCT: 38.7 % (ref 36.0–46.0)
Hemoglobin: 12.5 g/dL (ref 12.0–15.0)
Immature Granulocytes: 0 %
Lymphocytes Relative: 43 %
Lymphs Abs: 4.5 10*3/uL — ABNORMAL HIGH (ref 0.7–4.0)
MCH: 30.3 pg (ref 26.0–34.0)
MCHC: 32.3 g/dL (ref 30.0–36.0)
MCV: 93.9 fL (ref 80.0–100.0)
Monocytes Absolute: 0.9 10*3/uL (ref 0.1–1.0)
Monocytes Relative: 9 %
Neutro Abs: 4.9 10*3/uL (ref 1.7–7.7)
Neutrophils Relative %: 45 %
Platelets: 176 10*3/uL (ref 150–400)
RBC: 4.12 MIL/uL (ref 3.87–5.11)
RDW: 13.2 % (ref 11.5–15.5)
WBC: 10.6 10*3/uL — ABNORMAL HIGH (ref 4.0–10.5)
nRBC: 0 % (ref 0.0–0.2)

## 2022-03-26 LAB — ECHOCARDIOGRAM COMPLETE
Area-P 1/2: 2.51 cm2
Calc EF: 63.4 %
Height: 63 in
S' Lateral: 3.4 cm
Single Plane A2C EF: 60 %
Single Plane A4C EF: 65.9 %
Weight: 2640 oz

## 2022-03-26 MED ORDER — IOHEXOL 350 MG/ML SOLN
50.0000 mL | Freq: Once | INTRAVENOUS | Status: AC | PRN
Start: 2022-03-26 — End: 2022-03-26
  Administered 2022-03-26: 50 mL via INTRAVENOUS

## 2022-03-26 MED ORDER — PERFLUTREN LIPID MICROSPHERE
1.0000 mL | INTRAVENOUS | Status: AC | PRN
  Administered 2022-03-26: 3 mL via INTRAVENOUS

## 2022-03-26 NOTE — ED Provider Notes (Signed)
7:18 AM Care assumed from Dr. Ralene Bathe.  At time of transfer of care, patient is waiting for results of CT PE study and delta troponin prior to likely cardiology consultation for high risk chest pain that woke up the patient with associated shortness of breath.  Anticipate disposition based on their recommendations.  8:49 AM CT scan returned without evidence of pulmonary embolism or aortic dissection.  It does show she has possible cardiomegaly, slight dilation of her aorta that will need follow-up, some unchanged esophageal thickening, some pulmonary nodules that will need follow-up, but also shows some aortic and coronary atherosclerosis, possible elevated right heart pressures with some reflux of contrast.  I do not see evidence of previous cardiology evaluation or echo in the past.  I went to reassess the patient as she was describing her pain being a 5 out of 10, aching pressure on her left chest that went around towards her axilla with associated shortness of breath and she was concerned it was a heart attack.  We discussed that the second troponin was negative but due to the concerning story and CT imaging showing some coronary atherosclerosis, we will call cardiology to discuss either evaluation and admission versus outpatient follow-up.  Cardiology saw the patient and agree with the consultation order.  They requested an echo and if this is reassuring, anticipate discharge home  The echo was reportedly reassuring and patient be discharged for outpatient cardiology follow-up.  Patient and family agree with plan of care and patient will be now discharged.    Clinical Impression: 1. Atypical chest pain     Disposition: Discharge  Condition: Good  I have discussed the results, Dx and Tx plan with the pt(& family if present). He/she/they expressed understanding and agree(s) with the plan. Discharge instructions discussed at great length. Strict return precautions discussed and pt &/or family  have verbalized understanding of the instructions. No further questions at time of discharge.    New Prescriptions   No medications on file    Follow Up: Elouise Munroe, MD [address] [place] [address] [phone]  Follow up on 05/06/2022 8:20AM. Cardiology follow up     Caydee Talkington, Gwenyth Allegra, MD 03/26/22 1258

## 2022-03-26 NOTE — Discharge Instructions (Signed)
Your history, exam and workup today led [place] to get labs and imaging that were overall reassuring.  The cardiology team was asked to come see you and they feel you are safe for discharge home with close follow-up.  Please call to confirm your appointment and follow-up with them.  If any symptoms change or worsen acutely, please return to the nearest emergency department.

## 2022-03-26 NOTE — ED Triage Notes (Signed)
[facility] brought [facility] she was having some sharp pain under her left breast it subsided but then she got dizzy. Ems arrived and pain had still subsided but was dizzy. Ems gave 324 of asa  Bp 190/80

## 2022-03-26 NOTE — Consult Note (Signed)
Cardiology Consultation   Patient ID: HARRISON PAULSON MRN: [id number]; DOB: 1933/01/19  Admit date: 03/26/2022 Date of Consult: 03/26/2022  PCP:  Shirline Frees, MD   [place] Providers Cardiologist:  None   Dr. Margaretann Loveless     Patient Profile:   Sandra Mullins is a 88 y.o. female with a hx of hypertension and coronary artery calcifications noted on CT who is being seen 03/26/2022 for the evaluation of chest pain at the request of Dr. Sherry Ruffing.  History of Present Illness:   Sandra Mullins is an 88 year old female with a history of hypertension and no significant past cardiovascular history who presents with chest discomfort with position change that occurred around midnight last night.  Patient was awake and rolled over in bed when she felt a sharp pain across her left chest under the left breast.  This sharp discomfort then turned to a chest heaviness after that she became slightly short of breath which she attributed to potentially being anxious about the chest discomfort.  She was brought in by EMS where she has had a normal EKG and unremarkable troponins.  She underwent CT PE study which I have personally reviewed.  This demonstrates no proximal pulmonary emboli, positive for aortic atherosclerosis and coronary calcifications in the left main and proximal LAD, aortic root calcifications with no definite aortic valve calcifications, mild RV enlargement with some contrast reflux into the IVC.  Her son who is a Software engineer with a company called [employer] is present at the bedside and provides some collaborative history.  She has never had a chest pain episode like this previously and initially attributed it to musculoskeletal pain.  When it did not resolve and transition to a chest heaviness, she became concerned.  Laboratory studies reviewed and grossly normal, troponin 5>6.   Past Medical History:  Diagnosis Date   History of kidney stones    History of lower  GI bleeding 05/2013   due to colitis   Hypertension    Left ureteral stone    Nephrolithiasis    bilateral nonobstructive per CT 10-22-2017    Past Surgical History:  Procedure Laterality Date   ABDOMINAL HYSTERECTOMY  1970s   due to menorrhagia ; per pt retained ovaries   APPENDECTOMY  age 75   Open appy. States no rupture   CATARACT EXTRACTION     Unsure of the date   CYSTOSCOPY WITH RETROGRADE PYELOGRAM, URETEROSCOPY AND STENT PLACEMENT Left 11/12/2017   Procedure: CYSTOSCOPY WITH RETROGRADE PYELOGRAM, URETEROSCOPY AND STENT PLACEMENT;  Surgeon: Alexis Frock, MD;  Location: [facility];  Service: Urology;  Laterality: Left;   DILATION AND CURETTAGE OF UTERUS  x2  yrs ago   HOLMIUM LASER APPLICATION Left 5/57/3220   Procedure: HOLMIUM LASER APPLICATION;  Surgeon: Alexis Frock, MD;  Location: [facility];  Service: Urology;  Laterality: Left;   ROBOTIC ASSISTED BILATERAL SALPINGO OOPHERECTOMY Right 12/14/2017   Procedure: XI ROBOTIC ASSISTED RIGHT SALPINGO OOPHORECTOMY WITH LYSIS OF ADHESIONS;  Surgeon: Isabel Caprice, MD;  Location: [facility];  Service: Gynecology;  Laterality: Right;     Home Medications:  Prior to Admission medications   Medication Sig Start Date End Date Taking? Authorizing Provider  aspirin EC 81 MG tablet Take 81 mg by mouth 3 (three) times a week.    [provider]  estradiol (ESTRACE) 0.5 MG tablet Take 0.5 mg by mouth every morning.    [provider]  hydrochlorothiazide (HYDRODIURIL) 12.5 MG tablet  Take 12.5 mg by mouth daily.     [provider]  loratadine (CLARITIN) 10 MG tablet Take 10 mg by mouth daily as needed for allergies.    [provider]  losartan (COZAAR) 100 MG tablet Take 50 mg by mouth daily.     [provider]  metroNIDAZOLE (METROGEL) 0.75 % gel Apply 1 application topically daily as needed (for rosacea).     [provider]  Multiple  Vitamins-Minerals (WOMENS MULTI) CAPS Take 1 capsule by mouth daily.     [provider]    Inpatient Medications: Scheduled Meds:  Continuous Infusions:  PRN Meds:   Allergies:    Allergies  Allergen Reactions   Penicillins Other (See Comments)    Pt felt like she was going to die Has patient had a PCN reaction causing immediate rash, facial/tongue/throat swelling, SOB or lightheadedness with hypotension: No Has patient had a PCN reaction causing severe rash involving mucus membranes or skin necrosis: No Has patient had a PCN reaction that required hospitalization: No Has patient had a PCN reaction occurring within the last 10 years: No If all of the above answers are "NO", then may proceed with Cephalosporin use.     Social History:   Social History   Socioeconomic History   Marital status: Widowed    Spouse name: Not on file   Number of children: Not on file   Years of education: Not on file   Highest education level: Not on file  Occupational History   Not on file  Tobacco Use   Smoking status: Former    Types: Cigarettes    Quit date: 06/09/1971    Years since quitting: 50.8   Smokeless tobacco: Never   Tobacco comments:    smoked about 15 years; quit 1970's  Vaping Use   Vaping Use: Never used  Substance and Sexual Activity   Alcohol use: No   Drug use: No   Sexual activity: Not on file  Other Topics Concern   Not on file  Social History Narrative   Not on file   Social Determinants of Health   Financial Resource Strain: Not on file  Food Insecurity: Not on file  Transportation Needs: Not on file  Physical Activity: Not on file  Stress: Not on file  Social Connections: Not on file  Intimate Partner Violence: Not on file    Family History:    Family History  Problem Relation Age of Onset   Lung cancer Mother        smoker   COPD Father    Ovarian cancer Sister        was not offered surgery. Was in her late 85's     ROS:  Please  see the history of present illness.   All other ROS reviewed and negative.     Physical Exam/Data:   Vitals:   03/26/22 0545 03/26/22 0700 03/26/22 0809 03/26/22 1015  BP: (!) 151/77 (!) 170/88  (!) 163/83  Pulse: 64 67  65  Resp: (!) 21 (!) 28  (!) 27  Temp:   97.7 F (36.5 C)   TempSrc:   Oral   SpO2: 92% 96%  94%  Weight:      Height:       No intake or output data in the 24 hours ending 03/26/22 1018    03/26/2022    3:39 AM 04/12/2021    2:43 AM 01/19/2018    9:17 AM  Last 3 Weights  Weight (lbs) 165 lb 160 lb 153 lb 9.6 oz  Weight (kg) 74.844 kg 72.576 kg 69.673 kg     Body mass index is 29.23 kg/m.  General:  Well nourished, well developed, in no acute distress HEENT: normal Neck: no JVD Vascular: Distal pulses 2+ bilaterally Cardiac:  normal S1, S2; RRR; no murmur.  Chest wall is tender to palpation under the left breast. Lungs:  clear to auscultation bilaterally, no wheezing, rhonchi or rales  Abd: soft, nontender, no hepatomegaly  Ext: Trace puffy edema Musculoskeletal:  No deformities, BUE and BLE strength normal and equal Skin: warm and dry  Neuro:  CNs 2-12 intact, no focal abnormalities noted Psych:  Normal affect   EKG:  The EKG was personally reviewed and demonstrates: Sinus rhythm Telemetry:  Telemetry was personally reviewed and demonstrates: Sinus rhythm with frequent PACs  Relevant CV Studies: Echo pending  Laboratory Data:  High Sensitivity Troponin:   Recent Labs  Lab 03/26/22 0318 03/26/22 0642  TROPONINIHS 5 6     Chemistry Recent Labs  Lab 03/26/22 0318  NA 141  K 3.5  CL 104  CO2 26  GLUCOSE 108*  BUN 20  CREATININE 1.02*  CALCIUM 9.6  GFRNONAA 53*  ANIONGAP 11    Recent Labs  Lab 03/26/22 0318  PROT 6.7  ALBUMIN 3.4*  AST 26  ALT 16  ALKPHOS 52  BILITOT 0.2*   Lipids No results for input(s): "CHOL", "TRIG", "HDL", "LABVLDL", "LDLCALC", "CHOLHDL" in the last 168 hours.  Hematology Recent Labs  Lab  03/26/22 0318  WBC 10.6*  RBC 4.12  HGB 12.5  HCT 38.7  MCV 93.9  MCH 30.3  MCHC 32.3  RDW 13.2  PLT 176   Thyroid No results for input(s): "TSH", "FREET4" in the last 168 hours.  BNPNo results for input(s): "BNP", "PROBNP" in the last 168 hours.  DDimer No results for input(s): "DDIMER" in the last 168 hours.   Radiology/Studies:  CT Angio Chest PE W/Cm &/Or Wo Cm  Result Date: 03/26/2022 CLINICAL DATA:  Pulmonary embolism suspected.  High probability. EXAM: CT ANGIOGRAPHY CHEST WITH CONTRAST TECHNIQUE: Multidetector CT imaging of the chest was performed using the standard protocol during bolus administration of intravenous contrast. Multiplanar CT image reconstructions and MIPs were obtained to evaluate the vascular anatomy. RADIATION DOSE REDUCTION: This exam was performed according to the departmental dose-optimization program which includes automated exposure control, adjustment of the mA and/or kV according to patient size and/or use of iterative reconstruction technique. CONTRAST:  93m OMNIPAQUE IOHEXOL 350 MG/ML SOLN COMPARISON:  Portable chest today, CT abdomen and pelvis no contrast 10/22/2017. FINDINGS: Cardiovascular: There is mild panchamber cardiomegaly. Scattered calcific three-vessel CAD with no pericardial effusion. There is mild prominence of the superior pulmonary veins but no overt edema. The pulmonary arteries are normal caliber and do not show evidence of arterial embolus. There is IVC and hepatic vein reflux which could indicate elevated right heart pressures, tricuspid regurgitation or rapid contrast bolus. There is moderate patchy calcification in the arch and descending aorta, scattered calcification in the great vessels. Aortic opacification is insufficient to assess the lumen. No aortic hematoma is seen. The ascending aorta is borderline dilated at 3.9 cm. Remainder is normal caliber. Mediastinum/Nodes: The lower poles of the thyroid, axillary spaces and thoracic  trachea are unremarkable. There is mild thickening of the distal thoracic esophagus but no more than previously. The remainder is normal in thickness. There is no intrathoracic adenopathy. Lungs/Pleura: There is diffuse bronchial  thickening. There is mosaic attenuation of the lung fields most likely due to areas of air trapping alongside areas of microatelectasis due to small airways disease. No confluent pneumonia is seen. No subpleural edema. Not seen previously, there is a 6 mm posterior basal right lower lobe nodule, which is pleural based on 6:102. In the right upper lobe anteriorly, there is a 4 mm ground-glass nodule on 6:36. No other nodules are seen. The main bronchi are patent. There are scattered linear scar-like opacities in the bases. Upper Abdomen: No acute abnormality. Musculoskeletal: Mild osteopenia and degenerative change of the spine. No significant chest wall findings. Review of the MIP images confirms the above findings. IMPRESSION: 1. No evidence of arterial dilatation or embolus. 2. Cardiomegaly with mild prominence of the superior pulmonary veins but no overt edema. 3. IVC and hepatic vein reflux which could indicate elevated right heart pressures, tricuspid regurgitation or rapid contrast bolus. 4. Aortic and coronary artery atherosclerosis. 5. 6 mm posterior basal right lower lobe nodule, and a 4 mm ground-glass nodule in the right upper lobe. Per Fleischner Society Guidelines, recommend a non-contrast Chest CT at 6-12 months. If patient is high risk for malignancy, consider an additional non-contrast Chest CT at 18-24 months. If patient is low risk for malignancy, non-contrast Chest CT at 18-24 months is optional. These guidelines do not apply to immunocompromised patients and patients with cancer. Follow up in patients with significant comorbidities as clinically warranted. For lung cancer screening, adhere to Lung-RADS guidelines. Reference: Radiology. 2017; 284(1):228-43. 6. Bronchitis  with mosaic attenuation of the lung fields most likely due to air trapping. No confluent pneumonia. 7. Borderline dilated ascending aorta at 3.9 cm. Annual follow-up CTA or MRA recommended. 8. Distal esophageal thickening which could be due to esophagitis or reflux, less likely infiltrating disease. This seems unchanged since 2019. Electronically Signed   By: Telford Nab M.D.   On: 03/26/2022 07:04   DG Chest Port 1 View  Result Date: 03/26/2022 CLINICAL DATA:  Chest pain EXAM: PORTABLE CHEST 1 VIEW COMPARISON:  None Available. FINDINGS: The heart size and mediastinal contours are within normal limits. Both lungs are clear. The visualized skeletal structures are unremarkable. IMPRESSION: No active disease. Electronically Signed   By: Ulyses Jarred M.D.   On: 03/26/2022 03:14     Assessment and Plan:   1) Chest pain -  2) coronary calcifications and aortic atherosclerosis Likely noncardiac given normal EKG, unremarkable troponins, and chest pain that is tender to palpation in the same distribution as her presenting concern.  Chest pain quality is a little different on palpation as it was to her presenting chest pain.  She does have possible mild RV enlargement with some contrast reflux into the IVC on CT and does have coronary calcifications.  I think this warrants a conservative approach and I would obtain an echocardiogram for structural concerns.  If this is normal, patient can likely follow-up in the office for any further testing that may be required. -With coronary calcifications would recommend risk stratification with lipid panel, hemoglobin A1c.  If these have been recently completed at her primary care doctor's office, this can be reviewed, though I am unable to see this in the medical record. -Consider resuming patient's home antihypertensive therapy as she is hypertensive in the ED today.  Disposition pending results of echocardiogram, I have requested for this to be performed next to  facilitate ED discharge if possible.  Discussed with Dr. Sherry Ruffing.  3) PACs -Asymptomatic, can consider  addition of beta-blockade in outpatient follow-up.   Risk Assessment/Risk Scores:                For questions or updates, please contact [facility] Please consult [email or web address] for contact info under    Signed, Elouise Munroe, MD  03/26/2022 10:18 AM

## 2022-03-26 NOTE — ED Provider Notes (Signed)
[facility] EMERGENCY DEPARTMENT Provider Note   CSN: [id number] Arrival date & time: 03/26/22  0231     History  Chief Complaint  Patient presents with   Chest Pain    diz   Dizziness    Sandra Mullins is a 88 y.o. female.  The history is provided by the patient and medical records.  Chest Pain Associated symptoms: dizziness   Dizziness Associated symptoms: chest pain   Sandra Mullins is a 88 y.o. female who presents to the Emergency Department complaining of chest pain.  She presents to the ED by EMS for evaluation of sharp, left sided chest pain that woke her from sleep.  Her sharp pain was followed by a heaviness in her chest and under her arm.  Had associated sob. At time of ED arrival pain is resolved, but she does feel fatigued.  Received ASA 324 mg by EMS..    No recent illnesses.  No fever, cough, abdominal pain, N/V, leg swelling/pain.   Lives at [place].    Has a hx/o HTN.  No hx/o DVT/ PE.  Does not take hormones.      Home Medications Prior to Admission medications   Medication Sig Start Date End Date Taking? Authorizing Provider  aspirin EC 81 MG tablet Take 81 mg by mouth 3 (three) times a week.    [provider]  estradiol (ESTRACE) 0.5 MG tablet Take 0.5 mg by mouth every morning.    [provider]  hydrochlorothiazide (HYDRODIURIL) 12.5 MG tablet Take 12.5 mg by mouth daily.     [provider]  loratadine (CLARITIN) 10 MG tablet Take 10 mg by mouth daily as needed for allergies.    [provider]  losartan (COZAAR) 100 MG tablet Take 50 mg by mouth daily.     [provider]  metroNIDAZOLE (METROGEL) 0.75 % gel Apply 1 application topically daily as needed (for rosacea).     [provider]  Multiple Vitamins-Minerals (WOMENS MULTI) CAPS Take 1 capsule by mouth daily.     [provider]      Allergies    Penicillins    Review of Systems   Review of Systems   Cardiovascular:  Positive for chest pain.  Neurological:  Positive for dizziness.  All other systems reviewed and are negative.   Physical Exam Updated Vital Signs BP (!) 170/88   Pulse 67   Resp (!) 28   Ht '5\' 3"'$  (1.6 m)   Wt 74.8 kg   SpO2 96%   BMI 29.23 kg/m  Physical Exam Vitals and nursing note reviewed.  Constitutional:      Appearance: She is well-developed.  HENT:     Head: Normocephalic and atraumatic.  Cardiovascular:     Rate and Rhythm: Normal rate and regular rhythm.     Heart sounds: No murmur heard. Pulmonary:     Effort: Pulmonary effort is normal. No respiratory distress.     Breath sounds: Normal breath sounds.  Abdominal:     Palpations: Abdomen is soft.     Tenderness: There is no abdominal tenderness. There is no guarding or rebound.  Musculoskeletal:        General: No swelling or tenderness.  Skin:    General: Skin is warm and dry.  Neurological:     Mental Status: She is alert and oriented to person, place, and time.  Psychiatric:        Behavior: Behavior normal.  ED Results / Procedures / Treatments   Labs (all labs ordered are listed, but only abnormal results are displayed) Labs Reviewed  COMPREHENSIVE METABOLIC PANEL - Abnormal; Notable for the following components:      Result Value   Glucose, Bld 108 (*)    Creatinine, Ser 1.02 (*)    Albumin 3.4 (*)    Total Bilirubin 0.2 (*)    GFR, Estimated 53 (*)    All other components within normal limits  CBC WITH DIFFERENTIAL/PLATELET - Abnormal; Notable for the following components:   WBC 10.6 (*)    Lymphs Abs 4.5 (*)    All other components within normal limits  TROPONIN I (HIGH SENSITIVITY)  TROPONIN I (HIGH SENSITIVITY)    EKG EKG Interpretation  Date/Time:  Thursday March 26 2022 03:44:17 EST Ventricular Rate:  69 PR Interval:  180 QRS Duration: 105 QT Interval:  407 QTC Calculation: 436 R Axis:   53 Text Interpretation: Sinus rhythm Confirmed by Quintella Reichert [phone]) on 03/26/2022 3:47:54 AM  Radiology CT Angio Chest PE W/Cm &/Or Wo Cm  Result Date: 03/26/2022 CLINICAL DATA:  Pulmonary embolism suspected.  High probability. EXAM: CT ANGIOGRAPHY CHEST WITH CONTRAST TECHNIQUE: Multidetector CT imaging of the chest was performed using the standard protocol during bolus administration of intravenous contrast. Multiplanar CT image reconstructions and MIPs were obtained to evaluate the vascular anatomy. RADIATION DOSE REDUCTION: This exam was performed according to the departmental dose-optimization program which includes automated exposure control, adjustment of the mA and/or kV according to patient size and/or use of iterative reconstruction technique. CONTRAST:  30m OMNIPAQUE IOHEXOL 350 MG/ML SOLN COMPARISON:  Portable chest today, CT abdomen and pelvis no contrast 10/22/2017. FINDINGS: Cardiovascular: There is mild panchamber cardiomegaly. Scattered calcific three-vessel CAD with no pericardial effusion. There is mild prominence of the superior pulmonary veins but no overt edema. The pulmonary arteries are normal caliber and do not show evidence of arterial embolus. There is IVC and hepatic vein reflux which could indicate elevated right heart pressures, tricuspid regurgitation or rapid contrast bolus. There is moderate patchy calcification in the arch and descending aorta, scattered calcification in the great vessels. Aortic opacification is insufficient to assess the lumen. No aortic hematoma is seen. The ascending aorta is borderline dilated at 3.9 cm. Remainder is normal caliber. Mediastinum/Nodes: The lower poles of the thyroid, axillary spaces and thoracic trachea are unremarkable. There is mild thickening of the distal thoracic esophagus but no more than previously. The remainder is normal in thickness. There is no intrathoracic adenopathy. Lungs/Pleura: There is diffuse bronchial thickening. There is mosaic attenuation of the lung fields most  likely due to areas of air trapping alongside areas of microatelectasis due to small airways disease. No confluent pneumonia is seen. No subpleural edema. Not seen previously, there is a 6 mm posterior basal right lower lobe nodule, which is pleural based on 6:102. In the right upper lobe anteriorly, there is a 4 mm ground-glass nodule on 6:36. No other nodules are seen. The main bronchi are patent. There are scattered linear scar-like opacities in the bases. Upper Abdomen: No acute abnormality. Musculoskeletal: Mild osteopenia and degenerative change of the spine. No significant chest wall findings. Review of the MIP images confirms the above findings. IMPRESSION: 1. No evidence of arterial dilatation or embolus. 2. Cardiomegaly with mild prominence of the superior pulmonary veins but no overt edema. 3. IVC and hepatic vein reflux which could indicate elevated right heart pressures, tricuspid regurgitation or rapid contrast bolus. 4.  Aortic and coronary artery atherosclerosis. 5. 6 mm posterior basal right lower lobe nodule, and a 4 mm ground-glass nodule in the right upper lobe. Per Fleischner Society Guidelines, recommend a non-contrast Chest CT at 6-12 months. If patient is high risk for malignancy, consider an additional non-contrast Chest CT at 18-24 months. If patient is low risk for malignancy, non-contrast Chest CT at 18-24 months is optional. These guidelines do not apply to immunocompromised patients and patients with cancer. Follow up in patients with significant comorbidities as clinically warranted. For lung cancer screening, adhere to Lung-RADS guidelines. Reference: Radiology. 2017; 284(1):228-43. 6. Bronchitis with mosaic attenuation of the lung fields most likely due to air trapping. No confluent pneumonia. 7. Borderline dilated ascending aorta at 3.9 cm. Annual follow-up CTA or MRA recommended. 8. Distal esophageal thickening which could be due to esophagitis or reflux, less likely infiltrating  disease. This seems unchanged since 2019. Electronically Signed   By: Telford Nab M.D.   On: 03/26/2022 07:04   DG Chest Port 1 View  Result Date: 03/26/2022 CLINICAL DATA:  Chest pain EXAM: PORTABLE CHEST 1 VIEW COMPARISON:  None Available. FINDINGS: The heart size and mediastinal contours are within normal limits. Both lungs are clear. The visualized skeletal structures are unremarkable. IMPRESSION: No active disease. Electronically Signed   By: Ulyses Jarred M.D.   On: 03/26/2022 03:14    Procedures Procedures    Medications Ordered in ED Medications  iohexol (OMNIPAQUE) 350 MG/ML injection 50 mL (50 mLs Intravenous Contrast Given 03/26/22 5038)    ED Course/ Medical Decision Making/ A&P                           Medical Decision Making Amount and/or Complexity of Data Reviewed Labs: ordered. Radiology: ordered.  Risk Prescription drug management.   Patient with history of hypertension here for evaluation of chest pressure that woke her from sleep.  EKG is without acute ischemic changes.  Initial troponin is negative.  Given patient's associated shortness of breath a CTA PE study was obtained, which is negative for PE.  With the current presenting symptoms her heart score is 4.  Patient care transferred pending repeat troponin.        Final Clinical Impression(s) / ED Diagnoses Final diagnoses:  None    Rx / DC Orders ED Discharge Orders     None         Quintella Reichert, MD 03/26/22 [phone]

## 2022-05-06 ENCOUNTER — Ambulatory Visit: Payer: PPO | Admitting: Internal Medicine

## 2022-05-13 ENCOUNTER — Encounter: Payer: Self-pay | Admitting: Internal Medicine

## 2022-05-13 ENCOUNTER — Ambulatory Visit: Payer: PPO | Attending: Internal Medicine | Admitting: Internal Medicine

## 2022-05-13 VITALS — BP 130/78 | HR 82 | Ht 63.0 in | Wt 161.4 lb

## 2022-05-13 DIAGNOSIS — I10 Essential (primary) hypertension: Secondary | ICD-10-CM

## 2022-05-13 DIAGNOSIS — I2510 Atherosclerotic heart disease of native coronary artery without angina pectoris: Secondary | ICD-10-CM

## 2022-05-13 DIAGNOSIS — I2584 Coronary atherosclerosis due to calcified coronary lesion: Secondary | ICD-10-CM

## 2022-05-13 DIAGNOSIS — E782 Mixed hyperlipidemia: Secondary | ICD-10-CM

## 2022-05-13 DIAGNOSIS — R079 Chest pain, unspecified: Secondary | ICD-10-CM

## 2022-05-13 NOTE — Progress Notes (Signed)
Cardiology Office Note:    Date:  05/13/2022  ID:  Sandra, Mullins October 04, 1932, MRN [id number]  PCP:  Shirline Frees, MD  Cardiologist:  None  Electrophysiologist:  None   Referring MD: Shirline Frees, MD   Chief Complaint/Reason for Referral: Follow up on chest discomfort   History of Present Illness:    Sandra Mullins is a 89 y.o. female with a history of hypertension.  She presented in the ED on 03/26/2022 with chest pains and dizziness. She was evaluated for a cardiology consult at the request of Dr. Sherry Ruffing. Coronary artery calcifications were noted on CT PE study.   During this evaluation, she reported chest discomfort with position change that occurred around midnight the night before. She was awake and rolled over in bed when she felt a sharp pain across her left chest under the left breast.  The sharp discomfort then turned to a chest heaviness. After that she became slightly short of breath which she attributed to potentially being anxious about the chest discomfort.  She was brought in by EMS where she had a normal EKG and unremarkable troponins.  She underwent CT PE study which I had personally reviewed.  This demonstrated no proximal pulmonary emboli, positive for aortic atherosclerosis and coronary calcifications in the left main and proximal LAD, aortic root calcifications with no definite aortic valve calcifications, mild RV enlargement with some contrast reflux into the IVC.   She never had a chest pain episode like this previously and initially attributed it to musculoskeletal pain.  When it did not resolve and transitioned to a chest heaviness, she became concerned.   Laboratory studies reviewed and grossly normal, troponin 5>6.  Her echo was reassuring and she was discharged.   Today, she is accompanied by her daughter. She is overall feeling well. She continues to intermittently have chest discomfort. She believes that the  chest discomfort may be attributed to  anxiety about our visit today. Her father and mother both passed at 28 and following her turning 23 in October, she believes this may have been a trigger.    She has not been monitoring her blood pressure at home, but is planning to begin taking them. She used to check it regularly prior to being hospitalized, but she has gradually stopped since. We discussed that she should take it during her chest discomfort episodes.   She has dropped out of her exercise class that was 2 times a week. She found it difficult to get back into her routine after being hospitalized. She expressed interest in wanting to get back into her exercise classes.   We discussed her cholesterol levels and decreasing her LDL and triglycerides. Her LDL was 157 and triglycerides was 247. She overall will try to bring these down by changing her diet and continuing to exercise. I offered statin therapy today, she defers in favor of conservative approach which is reasonable.   She is compliant with all of her medications, Hydrochlorothiazide 12.5 and Losartan 100 mg, and baby aspirin 81 mg 3 times weekly. We reviewed recommendations from USPSTF on aspirin, she will discuss this with her PCP who she has been seeing since 1999.   She denies any palpitations, shortness of breath, or peripheral edema. No lightheadedness, headaches, syncope, orthopnea, or PND.  Past Medical History:  Diagnosis Date   History of kidney stones    History of lower GI bleeding 05/2013   due to colitis   Hypertension    Left ureteral stone  Nephrolithiasis    bilateral nonobstructive per CT 10-22-2017    Past Surgical History:  Procedure Laterality Date   ABDOMINAL HYSTERECTOMY  1970s   due to menorrhagia ; per pt retained ovaries   APPENDECTOMY  age 54   Open appy. States no rupture   CATARACT EXTRACTION     Unsure of the date   CYSTOSCOPY WITH RETROGRADE PYELOGRAM, URETEROSCOPY AND STENT PLACEMENT Left 11/12/2017   Procedure: CYSTOSCOPY WITH  RETROGRADE PYELOGRAM, URETEROSCOPY AND STENT PLACEMENT;  Surgeon: Alexis Frock, MD;  Location: [facility];  Service: Urology;  Laterality: Left;   DILATION AND CURETTAGE OF UTERUS  x2  yrs ago   HOLMIUM LASER APPLICATION Left 6/80/3212   Procedure: HOLMIUM LASER APPLICATION;  Surgeon: Alexis Frock, MD;  Location: [facility];  Service: Urology;  Laterality: Left;   ROBOTIC ASSISTED BILATERAL SALPINGO OOPHERECTOMY Right 12/14/2017   Procedure: XI ROBOTIC ASSISTED RIGHT SALPINGO OOPHORECTOMY WITH LYSIS OF ADHESIONS;  Surgeon: Isabel Caprice, MD;  Location: [facility];  Service: Gynecology;  Laterality: Right;    Current Medications: Current Meds  Medication Sig   aspirin EC 81 MG tablet Take 81 mg by mouth 3 (three) times a week.   hydrochlorothiazide (HYDRODIURIL) 12.5 MG tablet Take 12.5 mg by mouth daily.    loratadine (CLARITIN) 10 MG tablet Take 10 mg by mouth daily as needed for allergies.   losartan (COZAAR) 100 MG tablet Take 50 mg by mouth daily.    metroNIDAZOLE (METROGEL) 0.75 % gel Apply 1 application topically daily as needed (for rosacea).    Multiple Vitamins-Minerals (WOMENS MULTI) CAPS Take 1 capsule by mouth daily.      Allergies:   Penicillins   Social History   Tobacco Use   Smoking status: Former    Types: Cigarettes    Quit date: 06/09/1971    Years since quitting: 50.9   Smokeless tobacco: Never   Tobacco comments:    smoked about 15 years; quit 1970's  Vaping Use   Vaping Use: Never used  Substance Use Topics   Alcohol use: No   Drug use: No     Family History: The patient's family history includes COPD in her father; Lung cancer in her mother; Ovarian cancer in her sister.  ROS:   Please see the history of present illness.    (+) Chest discomfort  All other systems reviewed and are negative.  EKGs/Labs/Other Studies Reviewed:    The following studies were reviewed today:  EKG:  EKG is personally  reviewed. 05/13/2022: EKG was not ordered. 03/26/2022: Sinus rhythm. Rate 69 bpm.   Imaging studies that I have independently reviewed today:   CTA PE 03/26/2022: IMPRESSION: 1. No evidence of arterial dilatation or embolus. 2. Cardiomegaly with mild prominence of the superior pulmonary veins but no overt edema. 3. IVC and hepatic vein reflux which could indicate elevated right heart pressures, tricuspid regurgitation or rapid contrast bolus. 4. Aortic and coronary artery atherosclerosis. 5. 6 mm posterior basal right lower lobe nodule, and a 4 mm ground-glass nodule in the right upper lobe. Per Fleischner Society Guidelines, recommend a non-contrast Chest CT at 6-12 months. If patient is high risk for malignancy, consider an additional non-contrast Chest CT at 18-24 months. If patient is low risk for malignancy, non-contrast Chest CT at 18-24 months is optional. These guidelines do not apply to immunocompromised patients and patients with cancer. Follow up in patients with significant comorbidities as clinically warranted. For lung cancer screening, adhere to  Lung-RADS guidelines. Reference: Radiology. 2017; 284(1):228-43. 6. Bronchitis with mosaic attenuation of the lung fields most likely due to air trapping. No confluent pneumonia. 7. Borderline dilated ascending aorta at 3.9 cm. Annual follow-up CTA or MRA recommended. 8. Distal esophageal thickening which could be due to esophagitis or reflux, less likely infiltrating disease. This seems unchanged since 2019.  Echo 03/26/2022: IMPRESSIONS   1. Left ventricular ejection fraction, by estimation, is 60 to 65%. The  left ventricle has normal function. The left ventricle has no regional  wall motion abnormalities. There is mild asymmetric left ventricular  hypertrophy of the basal-septal segment.  Left ventricular diastolic parameters are consistent with Grade I  diastolic dysfunction (impaired relaxation).   2. Right  ventricular systolic function is normal. The right ventricular  size is normal. There is normal pulmonary artery systolic pressure.   3. The mitral valve is grossly normal. Trivial mitral valve  regurgitation. No evidence of mitral stenosis.   4. The aortic valve is tricuspid. There is mild calcification of the  aortic valve. Aortic valve regurgitation is not visualized. Aortic valve  sclerosis/calcification is present, without any evidence of aortic  stenosis.   5. The inferior vena cava is normal in size with greater than 50%  respiratory variability, suggesting right atrial pressure of 3 mmHg.    Recent Labs: 03/26/2022: ALT 16; BUN 20; Creatinine, Ser 1.02; Hemoglobin 12.5; Platelets 176; Potassium 3.5; Sodium 141  Recent Lipid Panel No results found for: "CHOL", "TRIG", "HDL", "CHOLHDL", "VLDL", "LDLCALC", "LDLDIRECT"  Physical Exam:    VS:  BP 130/78   Pulse 82   Ht '5\' 3"'$  (1.6 m)   Wt 161 lb 6.4 oz (73.2 kg)   SpO2 98%   BMI 28.59 kg/m     Wt Readings from Last 5 Encounters:  05/13/22 161 lb 6.4 oz (73.2 kg)  03/26/22 165 lb (74.8 kg)  04/12/21 160 lb (72.6 kg)  01/19/18 153 lb 9.6 oz (69.7 kg)  12/24/17 157 lb 3.2 oz (71.3 kg)    Constitutional: No acute distress Eyes: sclera non-icteric, normal conjunctiva and lids ENMT: normal dentition, moist mucous membranes Cardiovascular: regular rhythm, normal rate, no murmur. S1 and S2 normal. No jugular venous distention.  Respiratory: clear to auscultation bilaterally GI : normal bowel sounds, soft and nontender. No distention.   MSK: extremities warm, well perfused. No edema.  NEURO: grossly nonfocal exam, moves all extremities. PSYCH: alert and oriented x 3, normal mood and affect.   ASSESSMENT:    1. Chest pain, unspecified type   2. Primary hypertension   3. Mixed hyperlipidemia   4. Coronary artery calcification    PLAN:    Chest pain, unspecified type  Primary hypertension  Mixed  hyperlipidemia  Coronary artery calcification -She will continue to monitor her chest pain episodes.  We discussed pursuing stress testing today, she does not feel it is necessary at this time given that her episode in the hospital was a one-time occurrence and she overall feels well otherwise.  Her daughter is in agreement.  We participated in shared decision making and this is reasonable.  She will monitor episodes of chest pain and check her blood pressure during these episodes to correlate any episodic hypertension that may be contributing.  Blood pressure was quite elevated in the hospital however she had not had her antihypertensive therapy that day. -Lipids are elevated with LDL 157 and triglycerides 247, patient would prefer not to start medical therapy for lipid lowering and would like to  pursue diet and lifestyle modification.  Would recommend recheck lipids in 6 months. -Continue hydrochlorothiazide 12.5 mg daily and losartan 50 mg daily. -We reviewed aspirin guidelines and USPSTF recommendations, she will review this with her PCP.  No recent bleeding.  Total time of encounter: 30 minutes total time of encounter, including 20 minutes spent in face-to-face patient care on the date of this encounter. This time includes coordination of care and counseling regarding above mentioned problem list. Remainder of non-face-to-face time involved reviewing chart documents/testing relevant to the patient encounter and documentation in the medical record. I have independently reviewed documentation from referring provider.   Cherlynn Kaiser, MD, [place]   Shared Decision Making/Informed Consent:       Medication Adjustments/Labs and Tests Ordered: Current medicines are reviewed at length with the patient today.  Concerns regarding medicines are outlined above.   No orders of the defined types were placed in this encounter.  No orders of the defined types were placed in  this encounter.  Patient Instructions  Medication Instructions:  No changes at this time *If you need a refill on your cardiac medications before your next appointment, please call your pharmacy*   Follow-Up: [place], you and your health needs are our priority.  As part of our continuing mission to provide you with exceptional heart care, we have created designated Provider Care Teams.  These Care Teams include your primary Cardiologist (physician) and Advanced Practice Providers (APPs -  Physician Assistants and Nurse Practitioners) who all work together to provide you with the care you need, when you need it.   Your next appointment:   6 month(s)  Provider:   Dr.Demetra Moya        I,Rachel Rivera,acting as a scribe for Elouise Munroe, MD.,have documented all relevant documentation on the behalf of Elouise Munroe, MD,as directed by  Elouise Munroe, MD while in the presence of Elouise Munroe, MD.  I, Elouise Munroe, MD, have reviewed all documentation for this visit. The documentation on 05/13/22 for the exam, diagnosis, procedures, and orders are all accurate and complete.

## 2022-05-13 NOTE — Patient Instructions (Signed)
Medication Instructions:  No changes at this time *If you need a refill on your cardiac medications before your next appointment, please call your pharmacy*   Follow-Up: [place], you and your health needs are our priority.  As part of our continuing mission to provide you with exceptional heart care, we have created designated Provider Care Teams.  These Care Teams include your primary Cardiologist (physician) and Advanced Practice Providers (APPs -  Physician Assistants and Nurse Practitioners) who all work together to provide you with the care you need, when you need it.   Your next appointment:   6 month(s)  Provider:   Dr.Acharya

## 2022-06-16 DIAGNOSIS — L719 Rosacea, unspecified: Secondary | ICD-10-CM | POA: Diagnosis not present

## 2022-06-16 DIAGNOSIS — Z0000 Encounter for general adult medical examination without abnormal findings: Secondary | ICD-10-CM | POA: Diagnosis not present

## 2022-06-16 DIAGNOSIS — I10 Essential (primary) hypertension: Secondary | ICD-10-CM | POA: Diagnosis not present

## 2022-06-16 DIAGNOSIS — N1832 Chronic kidney disease, stage 3b: Secondary | ICD-10-CM | POA: Diagnosis not present

## 2022-06-16 DIAGNOSIS — Z9181 History of falling: Secondary | ICD-10-CM | POA: Diagnosis not present

## 2022-06-16 DIAGNOSIS — E7800 Pure hypercholesterolemia, unspecified: Secondary | ICD-10-CM | POA: Diagnosis not present

## 2022-08-05 ENCOUNTER — Telehealth: Payer: Self-pay | Admitting: Internal Medicine

## 2022-08-05 NOTE — Telephone Encounter (Signed)
Pt daughter received her recall letter in the mail to f/u in 6 months. She states pt was told at the last appt that her issue was more muscle related and if she was feeling fine she didn't need to f/u. She would like some clarification on if she should f/u in July or not. Please advise.

## 2022-08-06 NOTE — Telephone Encounter (Signed)
Parke Poisson, MD  You17 hours ago (5:20 PM)    OK to follow up as needed.    Returned call to patients daughter and made her aware. She will call back if needed.

## 2022-09-16 DIAGNOSIS — L814 Other melanin hyperpigmentation: Secondary | ICD-10-CM | POA: Diagnosis not present

## 2022-09-16 DIAGNOSIS — D225 Melanocytic nevi of trunk: Secondary | ICD-10-CM | POA: Diagnosis not present

## 2022-09-16 DIAGNOSIS — L821 Other seborrheic keratosis: Secondary | ICD-10-CM | POA: Diagnosis not present

## 2022-09-16 DIAGNOSIS — Z85828 Personal history of other malignant neoplasm of skin: Secondary | ICD-10-CM | POA: Diagnosis not present

## 2022-10-24 IMAGING — CT CT CERVICAL SPINE W/O CM
3 of 4 series · 13 of 33 positions shown, 16 images · non-contrast
Comparison: None.

CLINICAL DATA: Head trauma.  Unwitnessed fall

EXAM:
CT HEAD WITHOUT CONTRAST
CT CERVICAL SPINE WITHOUT CONTRAST
TECHNIQUE: Multidetector CT imaging of the head and cervical spine was
performed following the standard protocol without intravenous
contrast. Multiplanar CT image reconstructions of the cervical spine
were also generated.

[Series 4: orthogonal axials · axial · 0.21mm/px · z∈[+813,+926]mm · 5 of 112 slices shown, 7 images]
[im 19/112  soft-tissue]
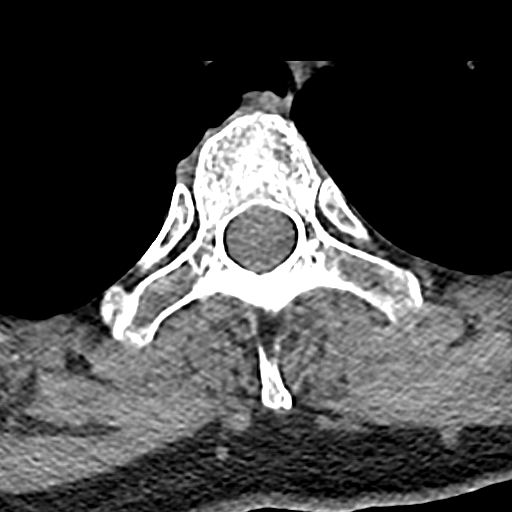
[im 19/112  bone]
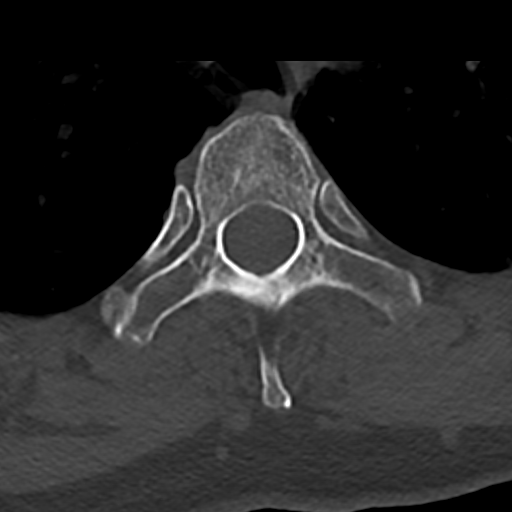
[im 38/112  bone]
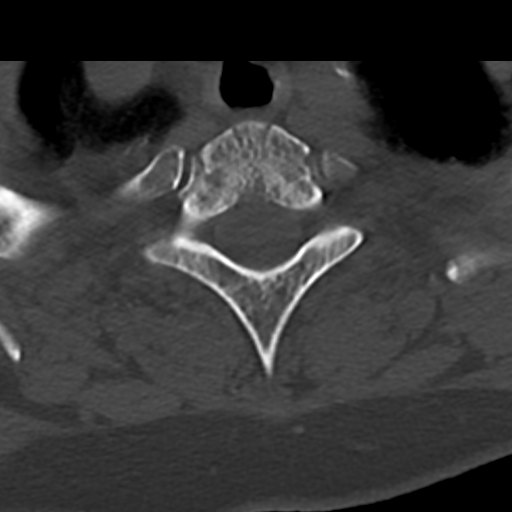
[im 56/112  bone]
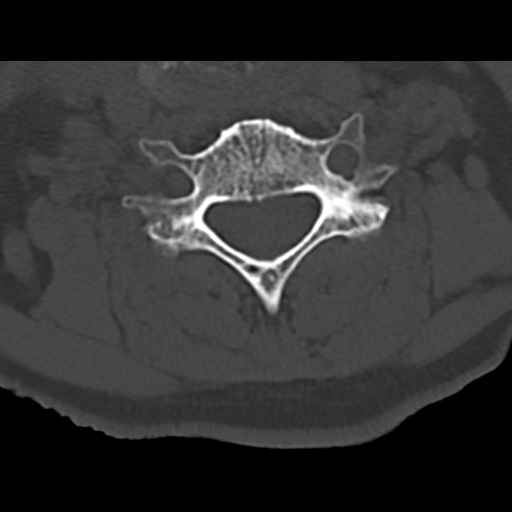
[im 75/112  bone]
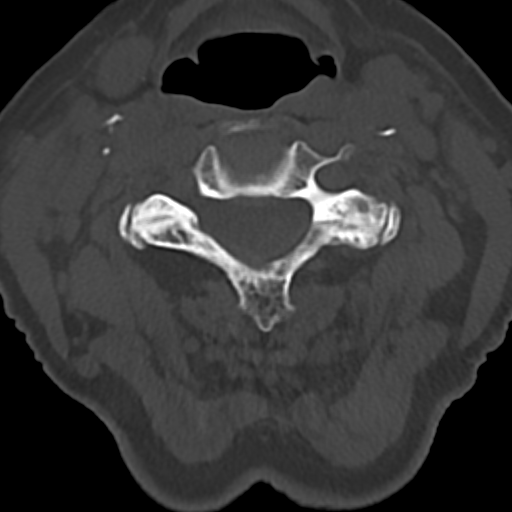
[im 93/112  soft-tissue]
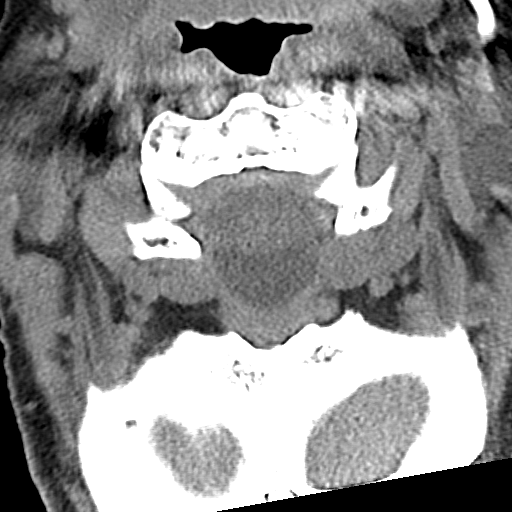
[im 93/112  bone]
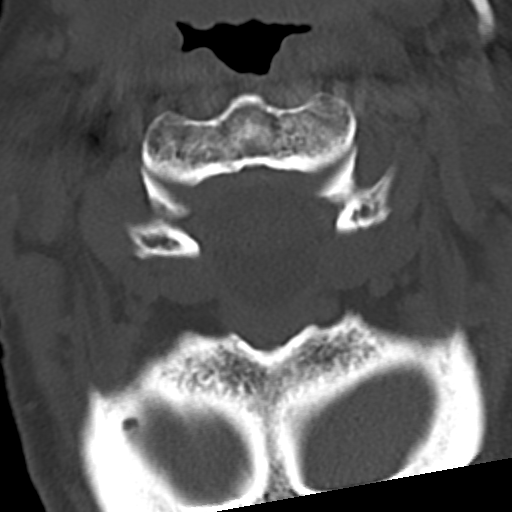

[Series 9: sag bone · sagittal · 0.38mm/px · 5 of 95 slices shown, 6 images]
[im 32/95  bone]
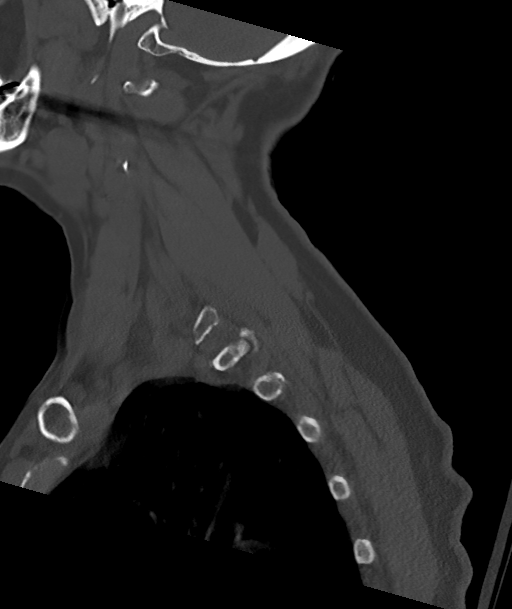
[im 40/95  bone]
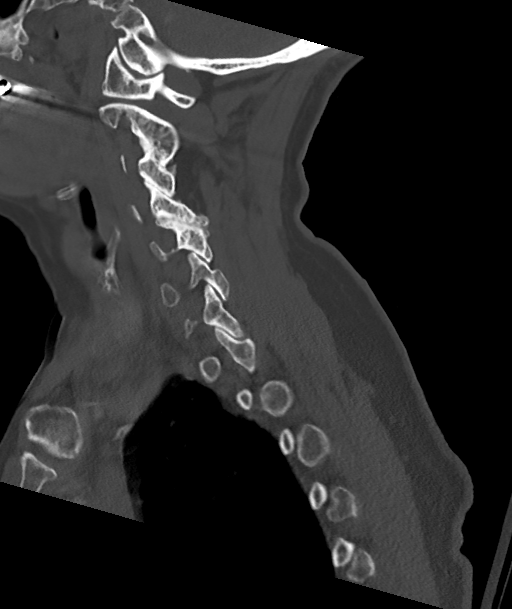
[im 48/95  soft-tissue]
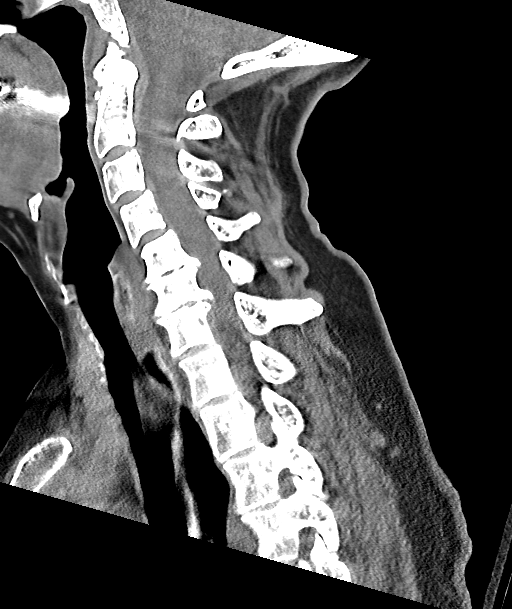
[im 48/95  bone]
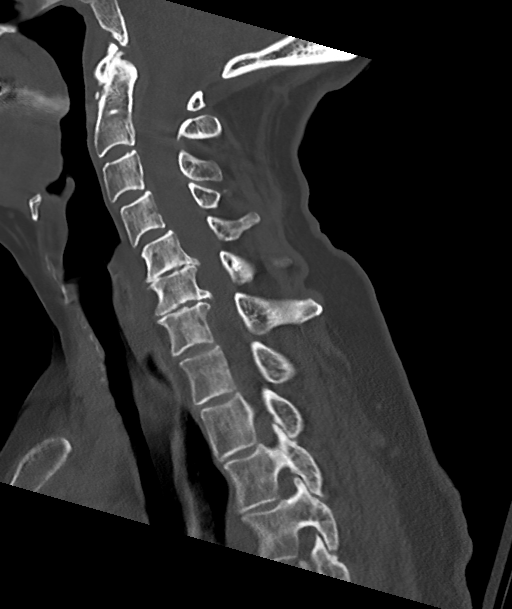
[im 55/95  bone]
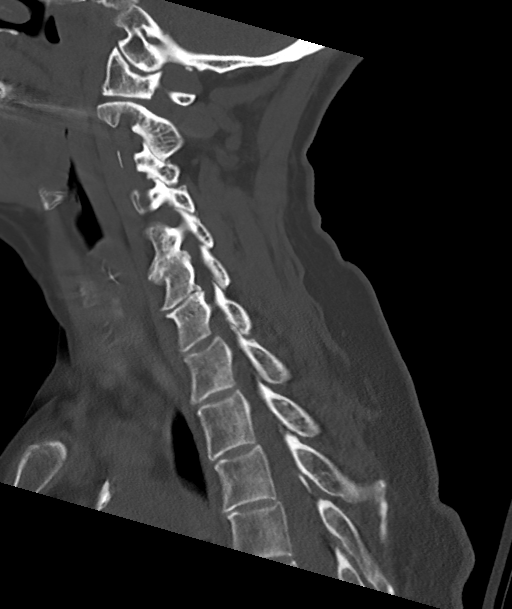
[im 63/95  bone]
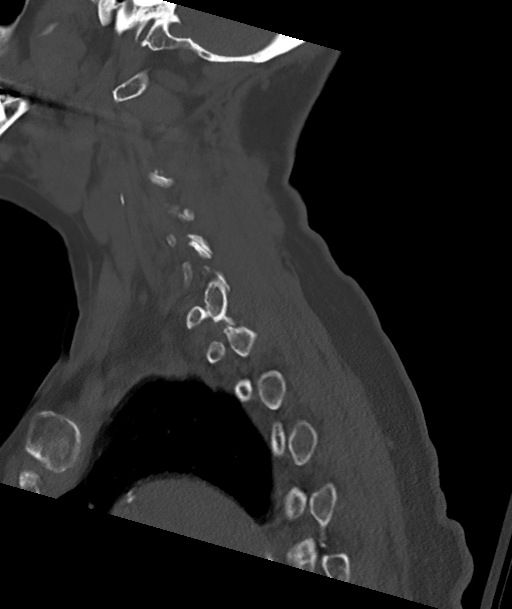

[Series 10: cor bone · coronal · 0.36mm/px · 3 of 83 slices shown]
[im 17/83  bone]
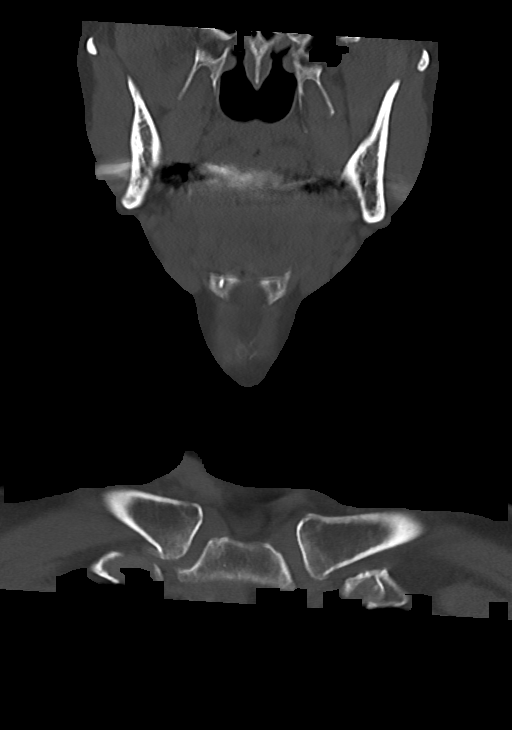
[im 33/83  bone]
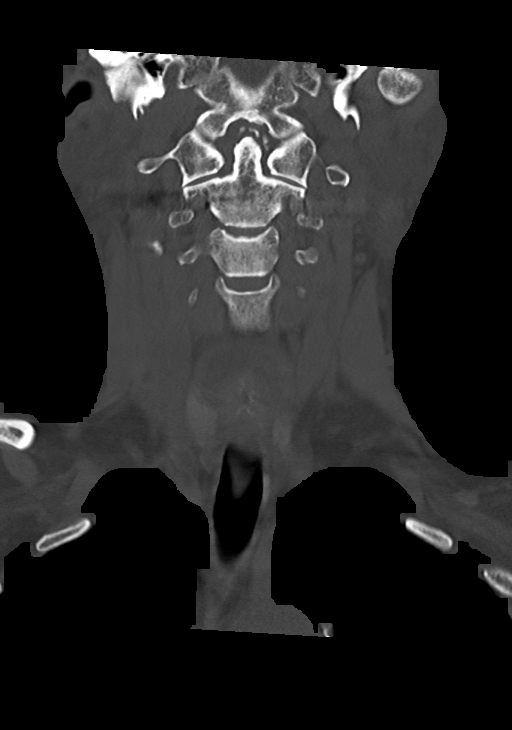
[im 50/83  bone]
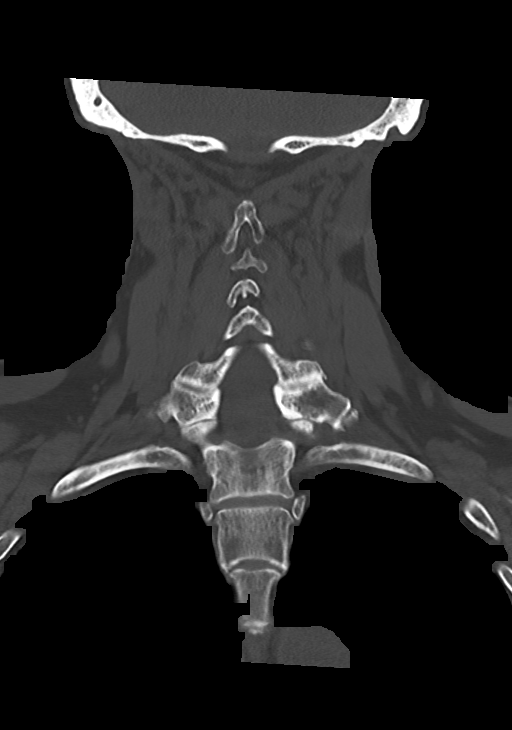

[13 of 33 positions shown; findings below may reference images not displayed]

FINDINGS: CT HEAD FINDINGS

Brain: No evidence of acute infarction, hemorrhage, hydrocephalus,
extra-axial collection or mass lesion/mass effect. Cerebral volume
loss in keeping with age

Vascular: No hyperdense vessel or unexpected calcification.

Skull: Normal. Negative for fracture or focal lesion.

Sinuses/Orbits: No acute finding.

CT CERVICAL SPINE FINDINGS

Alignment: No traumatic malalignment. C3-4 and C4-5 mild
anterolisthesis

Skull base and vertebrae: No acute fracture

Soft tissues and spinal canal: No prevertebral fluid or swelling. No
visible canal hematoma.

Disc levels: Upper cervical facet osteoarthritis and lower cervical
predominant disc degeneration. No visible cord impingement

Upper chest: No visible injury
IMPRESSION: No evidence of acute intracranial or cervical spine injury.

## 2022-10-27 ENCOUNTER — Other Ambulatory Visit: Payer: Self-pay | Admitting: Physician Assistant

## 2022-10-27 DIAGNOSIS — R21 Rash and other nonspecific skin eruption: Secondary | ICD-10-CM

## 2022-10-27 DIAGNOSIS — M25473 Effusion, unspecified ankle: Secondary | ICD-10-CM | POA: Diagnosis not present

## 2022-11-10 ENCOUNTER — Ambulatory Visit
Admission: RE | Admit: 2022-11-10 | Discharge: 2022-11-10 | Disposition: A | Discharge status: Home / Self Care | Payer: PPO | Source: Ambulatory Visit | Attending: Physician Assistant | Admitting: Physician Assistant

## 2022-11-10 DIAGNOSIS — R21 Rash and other nonspecific skin eruption: Secondary | ICD-10-CM

## 2022-11-10 DIAGNOSIS — I739 Peripheral vascular disease, unspecified: Secondary | ICD-10-CM | POA: Diagnosis not present

## 2022-12-01 DIAGNOSIS — M25552 Pain in left hip: Secondary | ICD-10-CM | POA: Diagnosis not present

## 2022-12-01 DIAGNOSIS — M48061 Spinal stenosis, lumbar region without neurogenic claudication: Secondary | ICD-10-CM | POA: Diagnosis not present

## 2022-12-01 DIAGNOSIS — M25551 Pain in right hip: Secondary | ICD-10-CM | POA: Diagnosis not present

## 2022-12-07 DIAGNOSIS — M545 Low back pain, unspecified: Secondary | ICD-10-CM | POA: Diagnosis not present

## 2022-12-07 DIAGNOSIS — M5126 Other intervertebral disc displacement, lumbar region: Secondary | ICD-10-CM | POA: Diagnosis not present

## 2022-12-07 DIAGNOSIS — M5136 Other intervertebral disc degeneration, lumbar region: Secondary | ICD-10-CM | POA: Diagnosis not present

## 2022-12-07 DIAGNOSIS — M5137 Other intervertebral disc degeneration, lumbosacral region: Secondary | ICD-10-CM | POA: Diagnosis not present

## 2022-12-07 DIAGNOSIS — M47816 Spondylosis without myelopathy or radiculopathy, lumbar region: Secondary | ICD-10-CM | POA: Diagnosis not present

## 2022-12-15 ENCOUNTER — Ambulatory Visit
Admission: RE | Admit: 2022-12-15 | Discharge: 2022-12-15 | Disposition: A | Discharge status: Home / Self Care | Payer: PPO | Source: Ambulatory Visit | Attending: Family Medicine | Admitting: Family Medicine

## 2022-12-15 ENCOUNTER — Other Ambulatory Visit: Payer: Self-pay | Admitting: Family Medicine

## 2022-12-15 DIAGNOSIS — M79672 Pain in left foot: Secondary | ICD-10-CM | POA: Diagnosis not present

## 2022-12-15 DIAGNOSIS — M4726 Other spondylosis with radiculopathy, lumbar region: Secondary | ICD-10-CM | POA: Diagnosis not present

## 2022-12-15 DIAGNOSIS — E7800 Pure hypercholesterolemia, unspecified: Secondary | ICD-10-CM | POA: Diagnosis not present

## 2022-12-15 DIAGNOSIS — I10 Essential (primary) hypertension: Secondary | ICD-10-CM | POA: Diagnosis not present

## 2022-12-15 DIAGNOSIS — M7732 Calcaneal spur, left foot: Secondary | ICD-10-CM | POA: Diagnosis not present

## 2022-12-15 DIAGNOSIS — R600 Localized edema: Secondary | ICD-10-CM | POA: Diagnosis not present

## 2022-12-31 ENCOUNTER — Other Ambulatory Visit: Payer: Self-pay

## 2022-12-31 ENCOUNTER — Emergency Department (HOSPITAL_COMMUNITY)
Admission: EM | Admit: 2022-12-31 | Discharge: 2022-12-31 | Disposition: A | Discharge status: Home / Self Care | Payer: PPO | Attending: Emergency Medicine | Admitting: Emergency Medicine

## 2022-12-31 ENCOUNTER — Encounter (HOSPITAL_COMMUNITY): Payer: Self-pay

## 2022-12-31 DIAGNOSIS — M79672 Pain in left foot: Secondary | ICD-10-CM | POA: Diagnosis not present

## 2022-12-31 DIAGNOSIS — I10 Essential (primary) hypertension: Secondary | ICD-10-CM | POA: Insufficient documentation

## 2022-12-31 DIAGNOSIS — Z79899 Other long term (current) drug therapy: Secondary | ICD-10-CM | POA: Insufficient documentation

## 2022-12-31 NOTE — ED Provider Notes (Signed)
[place] EMERGENCY DEPARTMENT AT [facility] Provider Note   CSN: [id number] Arrival date & time: 12/31/22  1427     History  Chief Complaint  Patient presents with   Foot Pain    Sandra Mullins is a 89 y.o. female.  Patient with history of spinal stenosis, hypertension presents to the emergency department today for evaluation of left foot pain.  This has been an ongoing problem for the patient over the past several weeks.  She has seen her doctor and had an x-ray of her foot performed which was reportedly negative.  She is currently awaiting an appointment with a specialist in order to evaluate whether or not this could be pain from her back.  Patient takes Tylenol daily without improvement.  She was walking on the foot this morning but it was sore.  She keeps having intermittent pains, approximately every 30 seconds, and the lateral aspect of the foot which are short-lived and severe.  She winces when these occur.       Home Medications Prior to Admission medications   Medication Sig Start Date End Date Taking? Authorizing Provider  aspirin EC 81 MG tablet Take 81 mg by mouth 3 (three) times a week.    [provider]  hydrochlorothiazide (HYDRODIURIL) 12.5 MG tablet Take 12.5 mg by mouth daily.     [provider]  loratadine (CLARITIN) 10 MG tablet Take 10 mg by mouth daily as needed for allergies.    [provider]  losartan (COZAAR) 100 MG tablet Take 50 mg by mouth daily.     [provider]  metroNIDAZOLE (METROGEL) 0.75 % gel Apply 1 application topically daily as needed (for rosacea).     [provider]  Multiple Vitamins-Minerals (WOMENS MULTI) CAPS Take 1 capsule by mouth daily.     [provider]      Allergies    Penicillins    Review of Systems   Review of Systems  Physical Exam Updated Vital Signs BP (!) 159/77 (BP Location: Right Arm)   Pulse 71   Temp 97.6 F (36.4 C) (Oral)   Resp 16    Ht 5\' 3"  (1.6 m)   Wt 73 kg   SpO2 94%   BMI 28.52 kg/m  Physical Exam Vitals and nursing note reviewed.  Constitutional:      Appearance: She is well-developed.  HENT:     Head: Normocephalic and atraumatic.  Eyes:     Conjunctiva/sclera: Conjunctivae normal.  Pulmonary:     Effort: No respiratory distress.  Musculoskeletal:     Cervical back: Normal range of motion and neck supple.     Left foot: Normal range of motion. No swelling, tenderness or bony tenderness. Normal pulse.     Comments: DP pulse palpable.  Patient reports pain that occurs over the lateral portion of the dorsal aspect of the foot.  Patient does have some visible veins, but not appear to be particularly engorged.  No overlying redness or swelling to suggest thrombophlebitis or cellulitis.  I am able to passively range to foot without the patient reacting.  Lower extremity is not particularly swollen.  Skin:    General: Skin is warm and dry.  Neurological:     Mental Status: She is alert.     ED Results / Procedures / Treatments   Labs (all labs ordered are listed, but only abnormal results are displayed) Labs Reviewed - No data to display  EKG None  Radiology  No results found.  Procedures Procedures    Medications Ordered in ED Medications - No data to display  ED Course/ Medical Decision Making/ A&P    Patient seen and examined. History obtained directly from patient and family member at bedside.   Labs/EKG: None ordered.   Imaging: None ordered.   Medications/Fluids: None.   Most recent vital signs reviewed and are as follows: BP (!) 159/77 (BP Location: Right Arm)   Pulse 71   Temp 97.6 F (36.4 C) (Oral)   Resp 16   Ht 5\' 3"  (1.6 m)   Wt 73 kg   SpO2 94%   BMI 28.52 kg/m   Initial impression: Foot pain, reassuring exam.  No focal point tenderness to suggest new fracture and history does not corroborate this.  I have low concern for arterial insufficiency.  Low concern for  DVT.  Possible neuropathic pain.  6:56 PM patient discussed with and seen by Dr. Eloise Harman.  Agrees no indication for further workup tonight.  Advised stretching, OTC meds.  Most current vital signs reviewed and are as follows: BP (!) 163/72 (BP Location: Right Arm)   Pulse 61   Temp 97.7 F (36.5 C) (Oral)   Resp 16   Ht 5\' 3"  (1.6 m)   Wt 73 kg   SpO2 98%   BMI 28.52 kg/m   Plan: Discharge to home.   Prescriptions written for: None  Other home care instructions discussed: Ice/heat, gentle stretching, OTC medications  ED return instructions discussed: Worsening or persistent pain  Follow-up instructions discussed: Patient encouraged to follow-up with their PCP in 5 days.                                   Medical Decision Making  Patient with intermittent left foot pain that was causing her difficulty walking today.  Foot appears well-perfused with normal pulses.  Low concern for arterial insufficiency.  No significant swelling or calf tenderness to suggest DVT.  Possibly neuropathic in nature.  No focal trauma, recent x-rays were negative.  Low concern for fracture.  Low concern for cellulitis as the area does not appear warm or red.  Patient will continue conservative measures and follow-up with PCP.        Final Clinical Impression(s) / ED Diagnoses Final diagnoses:  Left foot pain    Rx / DC Orders ED Discharge Orders     None         Renne Crigler, PA-C 12/31/22 1857    Rondel Baton, MD 01/02/23 1050

## 2022-12-31 NOTE — Discharge Instructions (Signed)
Please continue over-the-counter medications for the pain in your foot.  Please return if this becomes constant or if it is associated with significant redness or swelling.  Please follow-up with your doctor for recheck in the next few days.

## 2022-12-31 NOTE — ED Triage Notes (Addendum)
Complains of left foot pain that comes about every 30 secs that feels like a shock.  Reports hx of severe spinal stenosis. Reports discoloration to bilateral legs and feet that have been there "for a long time".

## 2023-02-11 DIAGNOSIS — M47816 Spondylosis without myelopathy or radiculopathy, lumbar region: Secondary | ICD-10-CM | POA: Diagnosis not present

## 2023-02-11 DIAGNOSIS — Z6829 Body mass index (BMI) 29.0-29.9, adult: Secondary | ICD-10-CM | POA: Diagnosis not present

## 2023-05-27 ENCOUNTER — Other Ambulatory Visit: Payer: Self-pay | Admitting: Family Medicine

## 2023-05-27 ENCOUNTER — Ambulatory Visit
Admission: RE | Admit: 2023-05-27 | Discharge: 2023-05-27 | Disposition: A | Discharge status: Home / Self Care | Payer: PPO | Source: Ambulatory Visit | Attending: Family Medicine | Admitting: Family Medicine

## 2023-05-27 DIAGNOSIS — R6889 Other general symptoms and signs: Secondary | ICD-10-CM

## 2023-12-22 DIAGNOSIS — N1832 Chronic kidney disease, stage 3b: Secondary | ICD-10-CM | POA: Diagnosis not present

## 2023-12-22 DIAGNOSIS — J301 Allergic rhinitis due to pollen: Secondary | ICD-10-CM | POA: Diagnosis not present

## 2023-12-22 DIAGNOSIS — I10 Essential (primary) hypertension: Secondary | ICD-10-CM | POA: Diagnosis not present

## 2023-12-22 DIAGNOSIS — M4726 Other spondylosis with radiculopathy, lumbar region: Secondary | ICD-10-CM | POA: Diagnosis not present

## 2023-12-22 DIAGNOSIS — E7800 Pure hypercholesterolemia, unspecified: Secondary | ICD-10-CM | POA: Diagnosis not present

## 2024-03-03 ENCOUNTER — Emergency Department (HOSPITAL_BASED_OUTPATIENT_CLINIC_OR_DEPARTMENT_OTHER)

## 2024-03-03 ENCOUNTER — Other Ambulatory Visit: Payer: Self-pay

## 2024-03-03 ENCOUNTER — Emergency Department (HOSPITAL_BASED_OUTPATIENT_CLINIC_OR_DEPARTMENT_OTHER)
Admission: EM | Admit: 2024-03-03 | Discharge: 2024-03-03 | Disposition: A | Discharge status: Home / Self Care | Source: Ambulatory Visit | Attending: Emergency Medicine | Admitting: Emergency Medicine

## 2024-03-03 ENCOUNTER — Encounter (HOSPITAL_BASED_OUTPATIENT_CLINIC_OR_DEPARTMENT_OTHER): Payer: Self-pay

## 2024-03-03 DIAGNOSIS — I10 Essential (primary) hypertension: Secondary | ICD-10-CM | POA: Diagnosis not present

## 2024-03-03 DIAGNOSIS — Z7982 Long term (current) use of aspirin: Secondary | ICD-10-CM | POA: Insufficient documentation

## 2024-03-03 DIAGNOSIS — R42 Dizziness and giddiness: Secondary | ICD-10-CM | POA: Diagnosis not present

## 2024-03-03 DIAGNOSIS — R519 Headache, unspecified: Secondary | ICD-10-CM | POA: Diagnosis not present

## 2024-03-03 LAB — CBC WITH DIFFERENTIAL/PLATELET
Abs Immature Granulocytes: 0.04 K/uL (ref 0.00–0.07)
Basophils Absolute: 0.1 K/uL (ref 0.0–0.1)
Basophils Relative: 1 %
Eosinophils Absolute: 0.5 K/uL (ref 0.0–0.5)
Eosinophils Relative: 4 %
HCT: 38.5 % (ref 36.0–46.0)
Hemoglobin: 12.7 g/dL (ref 12.0–15.0)
Immature Granulocytes: 0 %
Lymphocytes Relative: 44 %
Lymphs Abs: 5.4 K/uL — ABNORMAL HIGH (ref 0.7–4.0)
MCH: 30.2 pg (ref 26.0–34.0)
MCHC: 33 g/dL (ref 30.0–36.0)
MCV: 91.7 fL (ref 80.0–100.0)
Monocytes Absolute: 1 K/uL (ref 0.1–1.0)
Monocytes Relative: 9 %
Neutro Abs: 5.1 K/uL (ref 1.7–7.7)
Neutrophils Relative %: 42 %
Platelets: 179 K/uL (ref 150–400)
RBC: 4.2 MIL/uL (ref 3.87–5.11)
RDW: 12.4 % (ref 11.5–15.5)
WBC: 12.2 K/uL — ABNORMAL HIGH (ref 4.0–10.5)
nRBC: 0 % (ref 0.0–0.2)

## 2024-03-03 LAB — BASIC METABOLIC PANEL WITH GFR
Anion gap: 11 (ref 5–15)
BUN: 24 mg/dL — ABNORMAL HIGH (ref 8–23)
CO2: 27 mmol/L (ref 22–32)
Calcium: 10.5 mg/dL — ABNORMAL HIGH (ref 8.9–10.3)
Chloride: 99 mmol/L (ref 98–111)
Creatinine, Ser: 1.19 mg/dL — ABNORMAL HIGH (ref 0.44–1.00)
GFR, Estimated: 43 mL/min — ABNORMAL LOW (ref >60)
Glucose, Bld: 105 mg/dL — ABNORMAL HIGH (ref 70–99)
Potassium: 3.6 mmol/L (ref 3.5–5.1)
Sodium: 137 mmol/L (ref 135–145)

## 2024-03-03 MED ORDER — AMLODIPINE BESYLATE 5 MG PO TABS
2.5000 mg | ORAL_TABLET | Freq: Once | ORAL | Status: AC
Start: 2024-03-03 — End: 2024-03-03
  Administered 2024-03-03: 2.5 mg via ORAL
  Filled 2024-03-03: qty 1

## 2024-03-03 MED ORDER — ACETAMINOPHEN 500 MG PO TABS
1000.0000 mg | ORAL_TABLET | Freq: Once | ORAL | Status: AC
  Administered 2024-03-03: 1000 mg via ORAL
  Filled 2024-03-03: qty 2

## 2024-03-03 MED ORDER — AMLODIPINE BESYLATE 2.5 MG PO TABS: 2.5000 mg | ORAL_TABLET | Freq: Every day | ORAL | 2 refills | Status: AC

## 2024-03-03 NOTE — ED Provider Notes (Signed)
 [place] EMERGENCY DEPARTMENT AT [facility] Provider Note   CSN: [id number] Arrival date & time: 03/03/24  1849     Patient presents with: Headache   Sandra Mullins is a 89 y.o. female.   89 year old female with a history of hypertension and headaches who presents emergency room with headache.  Patient reports that today shortly after lunch she had a gradual onset headache.  Was frontal.  Started becoming holocephalic and now radiates to her occiput.  5/10 in severity when it was at its maximum.  Says it felt similar to headaches she has had in the past but they typically do not last this long.  She says she has not taken any pain medication for it.  No photo or phonophobia.  No vomiting.  Went to the nurse at her facility and also told her that she was feeling lightheaded and referred her to the emergency department for evaluation.  At this point in time she reports that her headache is a 2/10 in severity.  Denies any vision changes, jaw claudication, proximal muscle weakness or soreness, or fevers.  No neck pain.       Prior to Admission medications   Medication Sig Start Date End Date Taking? Authorizing Provider  amLODipine (NORVASC) 2.5 MG tablet Take 1 tablet (2.5 mg total) by mouth daily. 03/03/24  Yes Yolande Lamar BROCKS, MD  aspirin EC 81 MG tablet Take 81 mg by mouth 3 (three) times a week.    [provider]  hydrochlorothiazide (HYDRODIURIL) 12.5 MG tablet Take 12.5 mg by mouth daily.     [provider]  loratadine  (CLARITIN ) 10 MG tablet Take 10 mg by mouth daily as needed for allergies.    [provider]  losartan  (COZAAR ) 100 MG tablet Take 50 mg by mouth daily.     [provider]  metroNIDAZOLE  (METROGEL ) 0.75 % gel Apply 1 application topically daily as needed (for rosacea).     [provider]  Multiple Vitamins-Minerals (WOMENS MULTI) CAPS Take 1 capsule by mouth daily.     [provider]     Allergies: Penicillins    Review of Systems  Updated Vital Signs BP (!) 171/84   Pulse (!) 59   Temp 97.7 F (36.5 C) (Oral)   Resp 17   Ht 5' 3 (1.6 m)   Wt 72.1 kg   SpO2 94%   BMI 28.17 kg/m   Physical Exam Vitals and nursing note reviewed.  Constitutional:      General: She is not in acute distress.    Appearance: She is well-developed.  HENT:     Head: Normocephalic and atraumatic.     Right Ear: External ear normal.     Left Ear: External ear normal.     Nose: Nose normal.  Eyes:     Extraocular Movements: Extraocular movements intact.     Conjunctiva/sclera: Conjunctivae normal.     Pupils: Pupils are equal, round, and reactive to light.  Cardiovascular:     Rate and Rhythm: Normal rate and regular rhythm.     Heart sounds: No murmur heard. Musculoskeletal:     Cervical back: Normal range of motion and neck supple.     Right lower leg: No edema.     Left lower leg: No edema.  Skin:    General: Skin is warm and dry.  Neurological:     Mental Status: She is alert and oriented to person, place, and time. Mental status is at  baseline.     Comments: MENTAL STATUS: AAOx3 CRANIAL NERVES: II: Pupils equal and reactive 4 mm BL, no RAPD, no VF deficits III, IV, VI: EOM intact, no gaze preference or deviation, no nystagmus. V: normal sensation to light touch in V1, V2, and V3 segments bilaterally VII: no facial weakness or asymmetry, no nasolabial fold flattening VIII: normal hearing to speech and finger friction IX, X: normal palatal elevation, no uvular deviation XI: 5/5 head turn and 5/5 shoulder shrug bilaterally XII: midline tongue protrusion MOTOR: 5/5 strength in R shoulder flexion, elbow flexion and extension, and grip strength. 5/5 strength in L shoulder flexion, elbow flexion and extension, and grip strength.  5/5 strength in R hip and knee flexion, knee extension, ankle plantar and dorsiflexion. 5/5 strength in L hip and knee flexion, knee extension,  ankle plantar and dorsiflexion. SENSORY: Normal sensation to light touch in all extremities COORD: Normal finger to nose and heel to shin, no tremor, no dysmetria STATION: normal stance, no truncal ataxia GAIT: Normal  No temporal artery tenderness to palpation.  Temporal arteries 2+ bilaterally  Psychiatric:        Mood and Affect: Mood normal.     (all labs ordered are listed, but only abnormal results are displayed) Labs Reviewed  CBC WITH DIFFERENTIAL/PLATELET - Abnormal; Notable for the following components:      Result Value   WBC 12.2 (*)    Lymphs Abs 5.4 (*)    All other components within normal limits  BASIC METABOLIC PANEL WITH GFR - Abnormal; Notable for the following components:   Glucose, Bld 105 (*)    BUN 24 (*)    Creatinine, Ser 1.19 (*)    Calcium 10.5 (*)    GFR, Estimated 43 (*)    All other components within normal limits    EKG: None  Radiology: CT Head Wo Contrast Result Date: 03/03/2024 EXAM: CT HEAD WITHOUT CONTRAST 03/03/2024 10:50:42 PM TECHNIQUE: CT of the head was performed without the administration of intravenous contrast. Automated exposure control, iterative reconstruction, and/or weight based adjustment of the mA/kV was utilized to reduce the radiation dose to as low as reasonably achievable. COMPARISON: 05/27/2023 CLINICAL HISTORY: headache, dizzy FINDINGS: BRAIN AND VENTRICLES: No acute hemorrhage. No evidence of acute infarct. Chronic basal ganglia lacunar infarcts. No mass effect or midline shift. ORBITS: No acute abnormality. SINUSES: No acute abnormality. SOFT TISSUES AND SKULL: No acute soft tissue abnormality. No skull fracture. IMPRESSION: 1. No acute intracranial abnormality. 2. Chronic basal ganglia lacunar infarcts, unchanged from prior study. Electronically signed by: Oneil Devonshire MD 03/03/2024 10:58 PM EST RP Workstation: [id number]     Procedures   Medications Ordered in the ED  acetaminophen  (TYLENOL ) tablet 1,000 mg (1,000 mg  Oral Given 03/03/24 2218)  amLODipine (NORVASC) tablet 2.5 mg (2.5 mg Oral Given 03/03/24 2252)                                    Medical Decision Making Amount and/or Complexity of Data Reviewed Labs: ordered. Radiology: ordered.  Risk OTC drugs. Prescription drug management.   89 year old female with a history of hypertension and headaches who presents to the emergency department headache and dizziness  Initial Ddx:  Hypertensive emergency, migraine, subarachnoid hemorrhage, ICH  MDM/Course:  Patient presents to the emergency department with a headache.  It appears to be very mild and similar to headaches she has had in the past  but it sounds like her facility was concerned that she was complaining of lightheadedness as well.  On exam she is hypertensive into the 170s.  She does not have any focal neurologic deficits.  No temporal artery tenderness to palpation and no other red flag symptoms that would be concerning for temporal arteritis on history.  She underwent a CT of the head that did not show acute abnormality.  After being given Tylenol  upon re-evaluation headache had significantly improved.  She was also given amlodipine since I suspect she has undertreated hypertension.  With her workup today I suspect that she likely is having a migraine and it is unclear exactly what caused the lightheadedness but very low concern for stroke or any other life-threatening etiology this point in time.  Will start her on amlodipine 2.5 mg daily and have her follow-up with her primary doctor in several days.  This patient presents to the ED for concern of complaints listed in HPI, this involves an extensive number of treatment options, and is a complaint that carries with it a high risk of complications and morbidity. Disposition including potential need for admission considered.   Dispo: DC Home. Return precautions discussed including, but not limited to, those listed in the AVS. Allowed pt time  to ask questions which were answered fully prior to dc.  Additional history obtained from daughter Records reviewed Outpatient Clinic Notes The following labs were independently interpreted: Chemistry and show no acute abnormality I independently reviewed the following imaging with scope of interpretation limited to determining acute life threatening conditions related to emergency care: CT Head and agree with the radiologist interpretation with the following exceptions: none I personally reviewed and interpreted cardiac monitoring: normal sinus rhythm  I have reviewed the patients home medications and made adjustments as needed Social Determinants of health:  Geriatric  Portions of this note were generated with Scientist, clinical (histocompatibility and immunogenetics). Dictation errors may occur despite best attempts at proofreading.     Final diagnoses:  Nonintractable headache, unspecified chronicity pattern, unspecified headache type  Uncontrolled hypertension    ED Discharge Orders          Ordered    amLODipine (NORVASC) 2.5 MG tablet  Daily        03/03/24 2307               Yolande Lamar BROCKS, MD 03/05/24 1053

## 2024-03-03 NOTE — ED Notes (Signed)
 RN reviewed discharge instructions with pt. Pt verbalized understanding and had no further questions. VSS upon discharge.

## 2024-03-03 NOTE — ED Triage Notes (Signed)
 Pt reports headache starting this AM. Pt reports hx of migraines. Pt does not take any meds for it.

## 2024-03-03 NOTE — Discharge Instructions (Signed)
 You were seen for your headache in the emergency department.  You were given medicines which improved your symptoms.  At home, please take Tylenol  for your headache.  Take the amlodipine for your blood pressure.   Check your MyChart online for the results of any tests that had not resulted by the time you left the emergency department.   Follow-up with your primary doctor in 2-3 days regarding your visit.    Return immediately to the emergency department if you experience any of the following: chest pain, shortness of breath, vision changes, numbness or weakness of your arms or legs, or any other concerning symptoms.    Thank you for visiting our Emergency Department. It was a pleasure taking care of you today.

## 2024-03-17 DIAGNOSIS — J069 Acute upper respiratory infection, unspecified: Secondary | ICD-10-CM | POA: Diagnosis not present

## 2024-03-17 DIAGNOSIS — R0789 Other chest pain: Secondary | ICD-10-CM | POA: Diagnosis not present

## 2024-03-17 DIAGNOSIS — R0602 Shortness of breath: Secondary | ICD-10-CM | POA: Diagnosis not present

## 2024-03-17 DIAGNOSIS — B9689 Other specified bacterial agents as the cause of diseases classified elsewhere: Secondary | ICD-10-CM | POA: Diagnosis not present

## 2024-03-17 DIAGNOSIS — I77810 Thoracic aortic ectasia: Secondary | ICD-10-CM | POA: Diagnosis not present

## 2024-03-17 DIAGNOSIS — R053 Chronic cough: Secondary | ICD-10-CM | POA: Diagnosis not present
